# Patient Record
Sex: Female | Born: 1960 | Race: White | Hispanic: No | Marital: Married | Smoking: Current every day smoker
Health system: Southern US, Community
[De-identification: ages and names within clinical notes are randomized; demographics above are authoritative.]

## PROBLEM LIST (undated history)

## (undated) DIAGNOSIS — F419 Anxiety disorder, unspecified: Secondary | ICD-10-CM

## (undated) DIAGNOSIS — F329 Major depressive disorder, single episode, unspecified: Secondary | ICD-10-CM

## (undated) DIAGNOSIS — N3010 Interstitial cystitis (chronic) without hematuria: Secondary | ICD-10-CM

## (undated) DIAGNOSIS — K219 Gastro-esophageal reflux disease without esophagitis: Secondary | ICD-10-CM

## (undated) DIAGNOSIS — G43909 Migraine, unspecified, not intractable, without status migrainosus: Secondary | ICD-10-CM

## (undated) DIAGNOSIS — M26609 Unspecified temporomandibular joint disorder, unspecified side: Secondary | ICD-10-CM

## (undated) DIAGNOSIS — F32A Depression, unspecified: Secondary | ICD-10-CM

## (undated) DIAGNOSIS — E039 Hypothyroidism, unspecified: Secondary | ICD-10-CM

## (undated) DIAGNOSIS — M797 Fibromyalgia: Secondary | ICD-10-CM

## (undated) DIAGNOSIS — J45909 Unspecified asthma, uncomplicated: Secondary | ICD-10-CM

## (undated) HISTORY — PX: CARDIAC CATHETERIZATION: SHX172

---

## 1992-02-07 HISTORY — PX: NASAL SINUS SURGERY: SHX719

## 1997-10-17 ENCOUNTER — Emergency Department (HOSPITAL_COMMUNITY): Admission: EM | Admit: 1997-10-17 | Discharge: 1997-10-17 | Payer: Self-pay | Admitting: Emergency Medicine

## 2001-12-17 ENCOUNTER — Ambulatory Visit (HOSPITAL_BASED_OUTPATIENT_CLINIC_OR_DEPARTMENT_OTHER): Admission: RE | Admit: 2001-12-17 | Discharge: 2001-12-17 | Payer: Self-pay | Admitting: Urology

## 2001-12-17 HISTORY — PX: OTHER SURGICAL HISTORY: SHX169

## 2003-02-07 HISTORY — PX: ABDOMINAL HYSTERECTOMY: SHX81

## 2005-06-07 ENCOUNTER — Encounter: Payer: Self-pay | Admitting: Emergency Medicine

## 2008-09-08 HISTORY — PX: TRANSTHORACIC ECHOCARDIOGRAM: SHX275

## 2009-05-02 ENCOUNTER — Emergency Department (HOSPITAL_BASED_OUTPATIENT_CLINIC_OR_DEPARTMENT_OTHER): Admission: EM | Admit: 2009-05-02 | Discharge: 2009-05-02 | Payer: Self-pay | Admitting: Emergency Medicine

## 2009-05-02 ENCOUNTER — Ambulatory Visit: Payer: Self-pay | Admitting: Diagnostic Radiology

## 2009-11-01 ENCOUNTER — Encounter: Admission: RE | Admit: 2009-11-01 | Discharge: 2009-11-01 | Payer: Self-pay | Admitting: Neurosurgery

## 2009-11-16 ENCOUNTER — Ambulatory Visit (HOSPITAL_COMMUNITY)
Admission: RE | Admit: 2009-11-16 | Discharge: 2009-11-17 | Payer: Self-pay | Source: Home / Self Care | Admitting: Neurosurgery

## 2009-11-16 HISTORY — PX: ANTERIOR CERVICAL DECOMP/DISCECTOMY FUSION: SHX1161

## 2009-12-15 ENCOUNTER — Encounter: Admission: RE | Admit: 2009-12-15 | Discharge: 2009-12-15 | Payer: Self-pay | Admitting: Neurosurgery

## 2010-02-27 ENCOUNTER — Encounter: Payer: Self-pay | Admitting: Neurosurgery

## 2010-04-21 LAB — CBC
Hemoglobin: 14.2 g/dL (ref 12.0–15.0)
MCH: 33.5 pg (ref 26.0–34.0)
RBC: 4.24 MIL/uL (ref 3.87–5.11)
WBC: 7.5 10*3/uL (ref 4.0–10.5)

## 2010-04-21 LAB — SURGICAL PCR SCREEN: Staphylococcus aureus: NEGATIVE

## 2010-06-24 NOTE — Op Note (Signed)
NAME:  Kristin [name], Kristin [name]                         ACCOUNT NO.:  [id number]   MEDICAL RECORD NO.:  [id number]                   PATIENT TYPE:  AMB   LOCATION:  [facility]                                 FACILITY:  [facility]   PHYSICIAN:  Excell Seltzer. Annabell Howells, M.D.                 DATE OF BIRTH:  1960/05/17   DATE OF PROCEDURE:  12/17/2001  DATE OF DISCHARGE:                                 OPERATIVE REPORT   PROCEDURES:  1. Cystoscopy.  2. Hydrodistention of the bladder.  3. Urethral dilation.  4. Instillation of Pyridium and Marcaine.   PREOPERATIVE DIAGNOSIS:  Painful bladder with interstitial cystitis.   POSTOPERATIVE DIAGNOSIS:  Painful bladder with interstitial cystitis.   SURGEON:  Excell Seltzer. Annabell Howells, M.D.   ANESTHESIA:  General.   COMPLICATIONS:  None.   INDICATIONS:  [name] is a 50 year old white female with a history of  painful bladder, whom I did hydrodistention on 6-7 years ago with good  results.  She returns now with recurrent symptoms and after discussing  options, she wants to have the hydrodistention performed again.   FINDINGS AND PROCEDURE:  The patient was given p.o. Tequin.  She was taken  to the operating room where a general anesthetic was induced.  She was  placed in lithotomy position.  A B&O suppository was placed.  Her genitalia  was prepped with Betadine solution.  She was draped in the usual sterile  fashion.  Cystoscopy was performed using the 22 [place] scope and 12 and 70  degree lenses.  Examination revealed a normal urethra.  The bladder wall had  mild trabeculation without tumor, stones, or inflammation.  There was a  stellate scar from prior biopsy on the posterior wall of the bladder.  The  ureteral orifices were unremarkable.  After a thorough cystoscopy, the scope  was removed, and the 16 French Foley catheter was inserted.  The balloon was  filled with 10 cc of air, and her bladder was then filled to capacity under  80 cm of water pressure.  This was  held for five minutes, and the bladder  was then drained.  She held 900 cc under anesthesia.  The  terminal efflux  remained clear.  Cystoscopy was performed after hydrodistention.  This  revealed diffuse scattered glomerulations on the right and left lateral  walls of the bladder.  No cracking or Hunner's ulcers were identified.  At  this point, the bladder was drained, and the urethra was calibrated to 34  [place].  There was no evidence of stenosis or stricture.  A Foley catheter  was then reinserted  and 15 cc of 0.25% Marcaine with 400 mg of crushed Pyridium were instilled  into the bladder.  The catheter was removed.  The patient was taken down  from lithotomy position.  Her anesthetic was reversed.  She was moved to the  recovery room in stable condition.  There were no complications.                                               Excell Seltzer. Annabell Howells, M.D.    JJW/MEDQ  D:  12/17/2001  T:  12/17/2001  Job:  [id number]   cc:   Dr. Garwin Brothers [place], [place]

## 2010-09-19 ENCOUNTER — Other Ambulatory Visit (HOSPITAL_COMMUNITY)
Admission: RE | Admit: 2010-09-19 | Discharge: 2010-09-19 | Disposition: A | Discharge status: Home / Self Care | Payer: Managed Care, Other (non HMO) | Source: Ambulatory Visit | Attending: Obstetrics & Gynecology | Admitting: Obstetrics & Gynecology

## 2010-09-19 DIAGNOSIS — Z1272 Encounter for screening for malignant neoplasm of vagina: Secondary | ICD-10-CM | POA: Insufficient documentation

## 2012-01-07 IMAGING — CT CT CERVICAL SPINE W/ CM
4 of 5 series · 15 of 35 positions shown, 16 images · IV contrast (omnipaque)
Comparison: Radiography [DATE].  Outside MRI.

CLINICAL DATA: Neck, bilateral shoulder and arm pain.

 MYELOGRAM INJECTION
TECHNIQUE: Informed consent was obtained from the patient prior to
the procedure, including potential complications of headache,
allergy, infection and pain.  A timeout procedure was performed.
With the patient prone, the lower back was prepped with Betadine.
1% Lidocaine was used for local anesthesia.  Lumbar puncture was
performed at the right L3-4 level using a 22 gauge needle with
return of clear CSF.  Nine ml of Omnipaque 033was injected into the
subarachnoid space .
TECHNIQUE: Following injection of intrathecal Omnipaque contrast,
spine imaging in multiple projections was performed using
fluoroscopy.
Fluoroscopy Time: 59 seconds .
TECHNIQUE: CT imaging of the cervical spine was performed after
intrathecal contrast administration. Multiplanar CT image
reconstructions were also generated.

[Series 2: c spine bone · axial · 0.27mm/px · z∈[+147,+210]mm · 2 of 75 slices shown]
[im 25/75  bone]
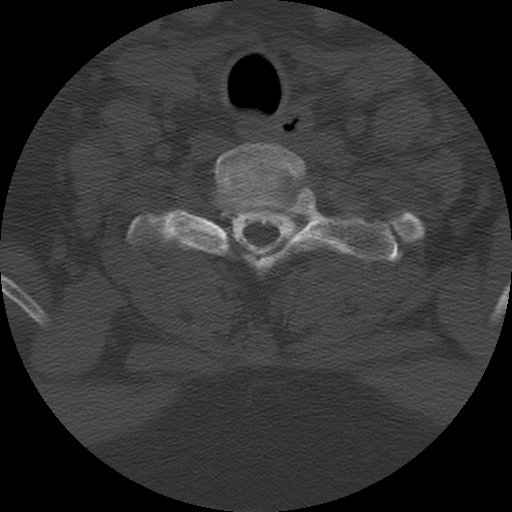
[im 50/75  bone]
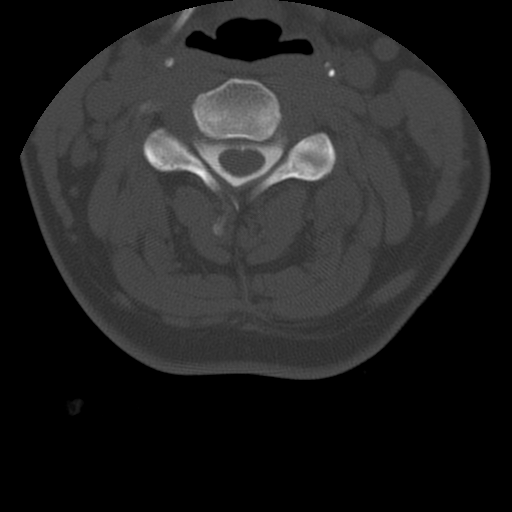

[Series 400: cor c spine · coronal · 0.38mm/px · 3 of 40 slices shown]
[im 8/40  bone]
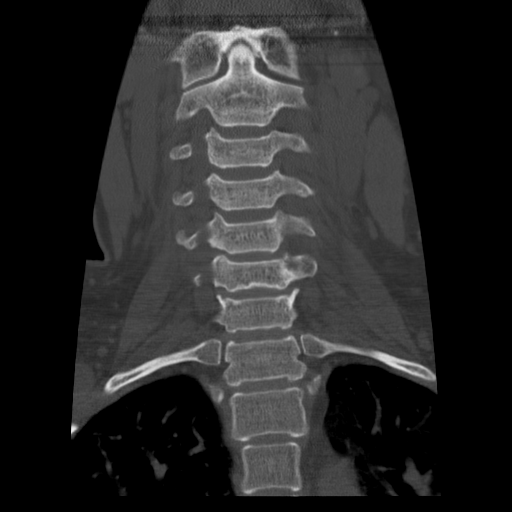
[im 16/40  bone]
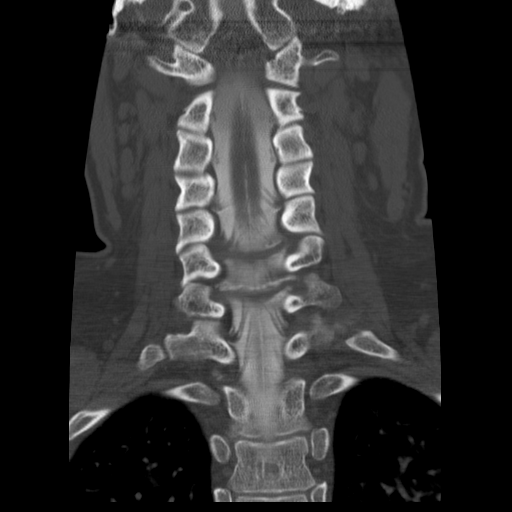
[im 24/40  bone]
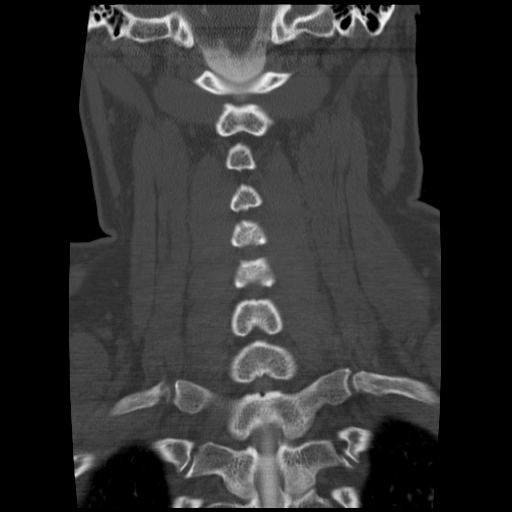

[Series 401: sag c spine · sagittal · 0.38mm/px · 6 of 40 slices shown]
[im 14/40  bone]
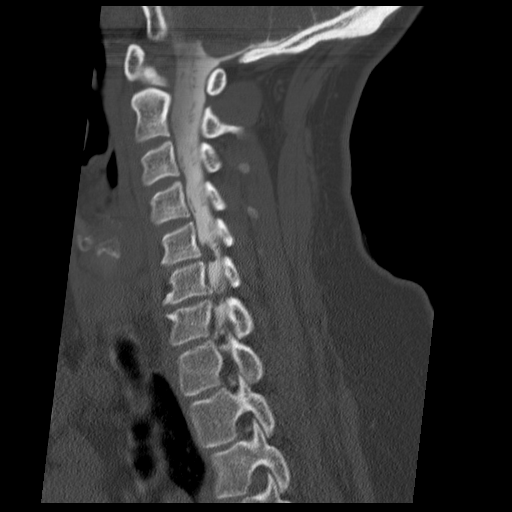
[im 17/40  bone]
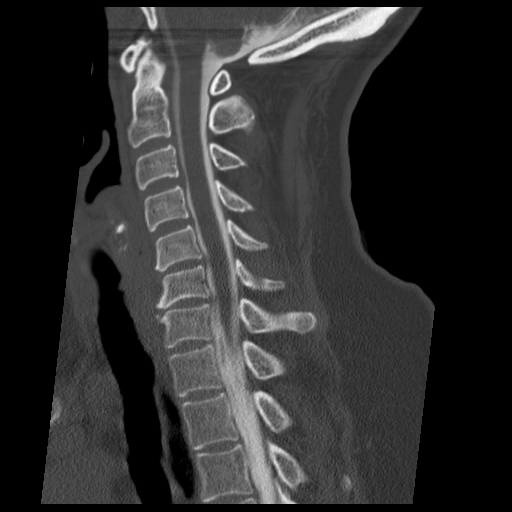
[im 20/40  bone]
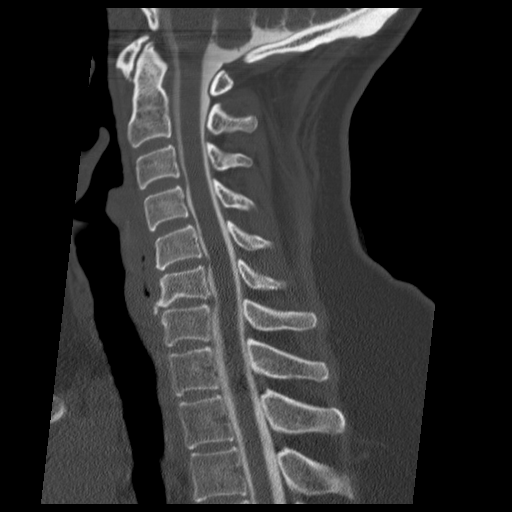
[im 23/40  bone]
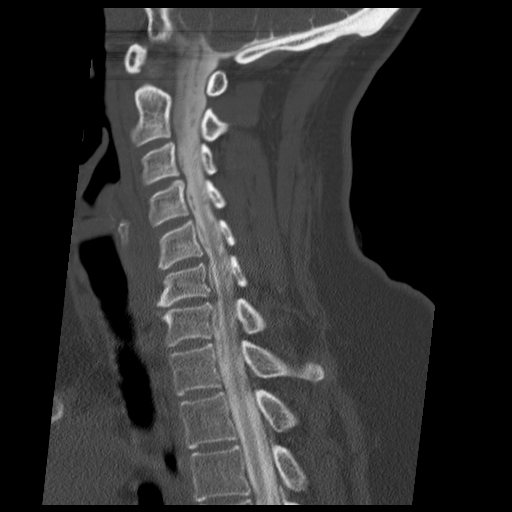
[im 27/40  bone]
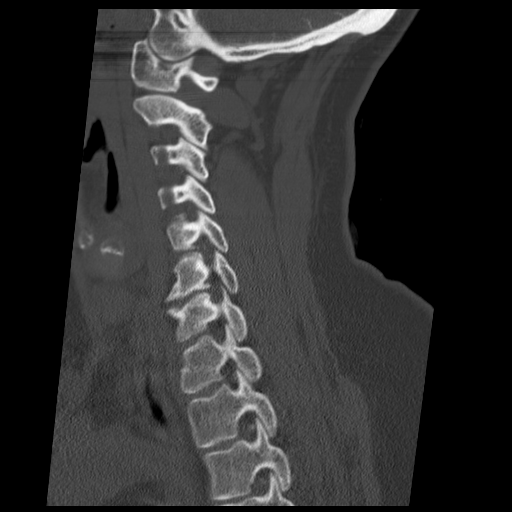
[im 28/40  soft-tissue]
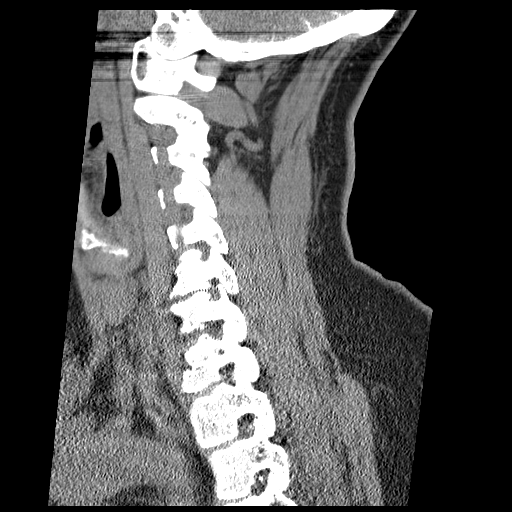

[Series 402: angled axial c spine · axial · 0.23mm/px · z∈[+113,+208]mm · 4 of 104 slices shown, 5 images]
[im 21/104  soft-tissue]
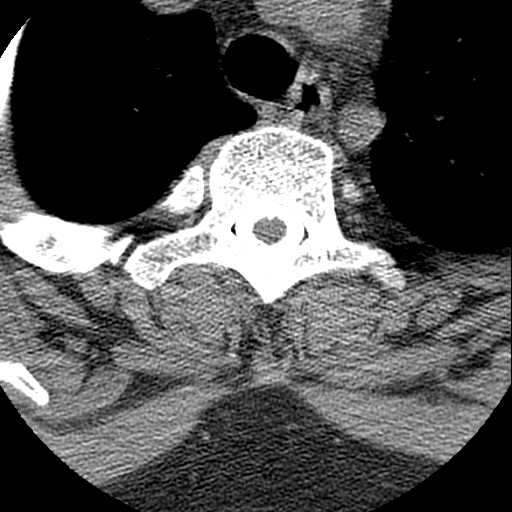
[im 21/104  bone]
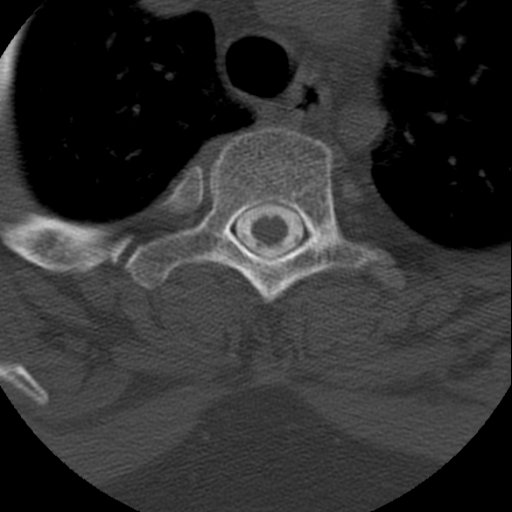
[im 42/104  bone]
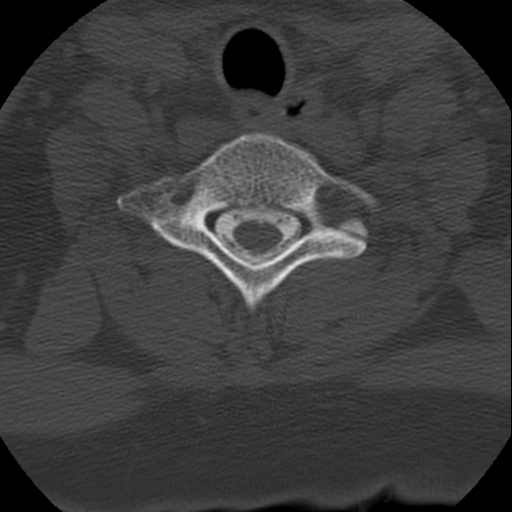
[im 62/104  bone]
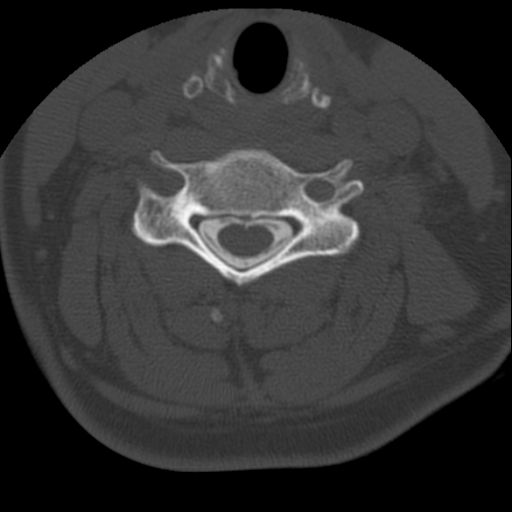
[im 83/104  bone]
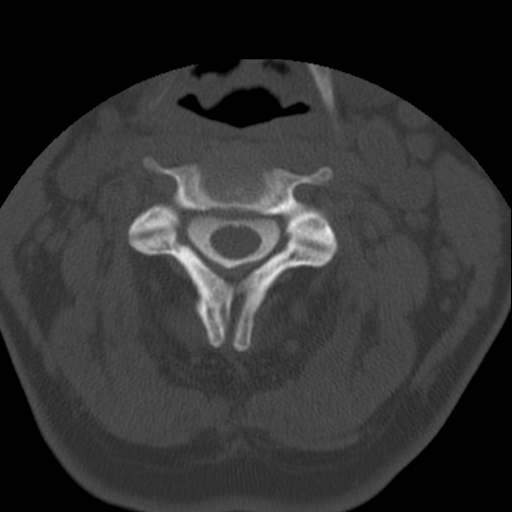

[15 of 35 positions shown; findings below may reference images not displayed]

IMPRESSION: Successful injection of  intrathecal contrast for myelography.

MYELOGRAM CERVICAL
FINDINGS: There is diminished filling of both C5, C6 and C7 root
sleeves.  There may be slightly diminished filling of the C8 root
sleeves.  The largest abnormality effects the right C6 root sleeve
and the left C7 root sleeve.  There are anterior extradural defects
at these levels but no apparent compressive effect upon the cord.
IMPRESSION: Diminished filling of both C5, C6 and C7 root sleeves.  Slightly
diminished filling of the C8 root sleeves.  See above.

CT MYELOGRAPHY CERVICAL SPINE
FINDINGS: The foramen magnum is widely patent.  There is ordinary
degenerative change between the anterior arch of C1 and the dens.
No neural compression.

C2-3:  Normal interspace.

C3-4:  Minimal uncovertebral prominence.  No significant narrowing
of the canal or foramina.

C4-5:  There is uncovertebral prominence bilaterally, slightly more
on the right.  There is mild foraminal narrowing, not definitely
compressive.

C5-6:  There is spondylosis with endplate osteophytes and shallow
protrusion of disc material.  There is a right posterolateral to
foraminal disc herniation apparently compressing the right C6 nerve
root with marked swelling of the nerve root.  There is also
considerable but slightly less pronounced foraminal disease on the
left.

At C6-7, there is spondylosis with endplate osteophytes and
protruding disc material.  There is a large soft tissue density
lesion in the foramen on the left consistent with a foraminal disc
herniation.  This could certainly compress the left C7 root sleeve.
There is moderate foraminal narrowing on the right primarily
because of osteophytic encroachment.

At C7-T1, there is mild uncovertebral prominence bilaterally but no
likely compressive lesion.
IMPRESSION: C5-6:  Large soft tissue density foraminal lesion on the right
likely represent a large right foraminal disc herniation
compressing the right C6 nerve root.  Lateral recess and foraminal
stenosis on the left as well, not as severe.

C6-7:  Large soft tissue density foraminal lesion on the left
likely to represent a large left foraminal disc herniation that
would certainly compress the left C7 root sleeve.  There is
foraminal stenosis on the right as well, primarily bony.

C4-5:  Mild uncovertebral degeneration bilaterally mild foraminal
narrowing, not grossly compressive.

## 2012-01-07 IMAGING — RF DG MYELOGRAM CERVICAL
8 series · 8 of 8 positions shown · IV contrast (omnipaque)
Comparison: Radiography [DATE].  Outside MRI.

CLINICAL DATA: Neck, bilateral shoulder and arm pain.

 MYELOGRAM INJECTION
TECHNIQUE: Informed consent was obtained from the patient prior to
the procedure, including potential complications of headache,
allergy, infection and pain.  A timeout procedure was performed.
With the patient prone, the lower back was prepped with Betadine.
1% Lidocaine was used for local anesthesia.  Lumbar puncture was
performed at the right L3-4 level using a 22 gauge needle with
return of clear CSF.  Nine ml of Omnipaque 033was injected into the
subarachnoid space .
TECHNIQUE: Following injection of intrathecal Omnipaque contrast,
spine imaging in multiple projections was performed using
fluoroscopy.
Fluoroscopy Time: 59 seconds .
TECHNIQUE: CT imaging of the cervical spine was performed after
intrathecal contrast administration. Multiplanar CT image
reconstructions were also generated.

[Series 1: myelogram  white · 1 of 1 slices shown (1 of 7)]
[im 1/1]
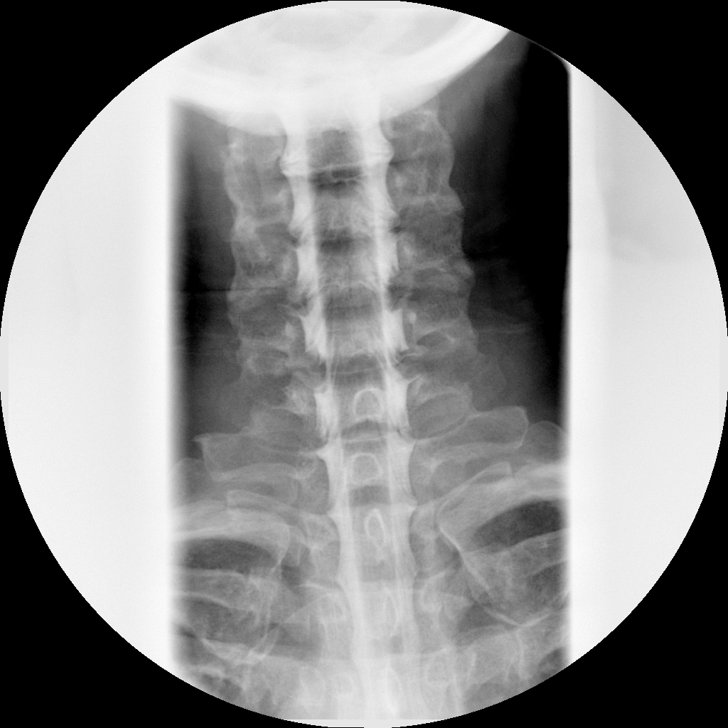

[Series 2: myelogram  white · 1 of 1 slices shown (2 of 7)]
[im 1/1]
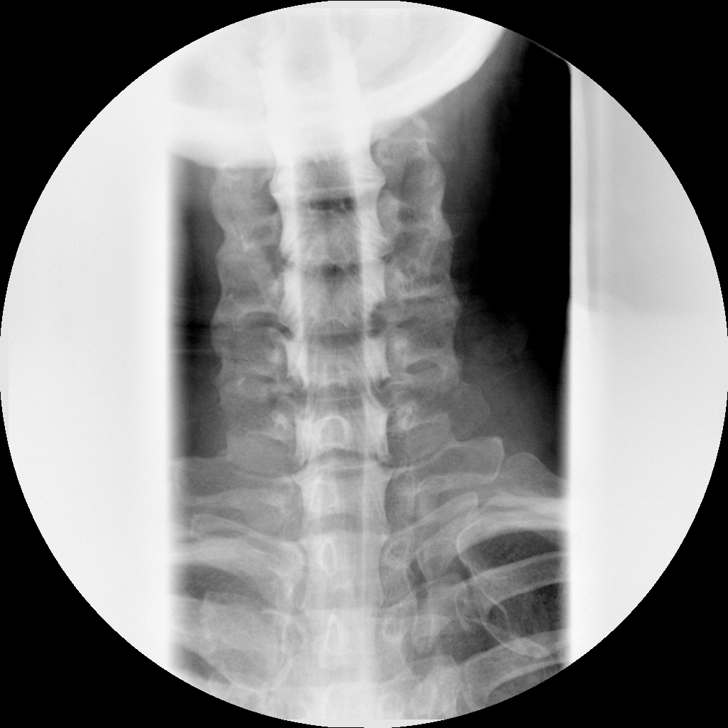

[Series 3: myelogram  white · 1 of 1 slices shown (3 of 7)]
[im 1/1]
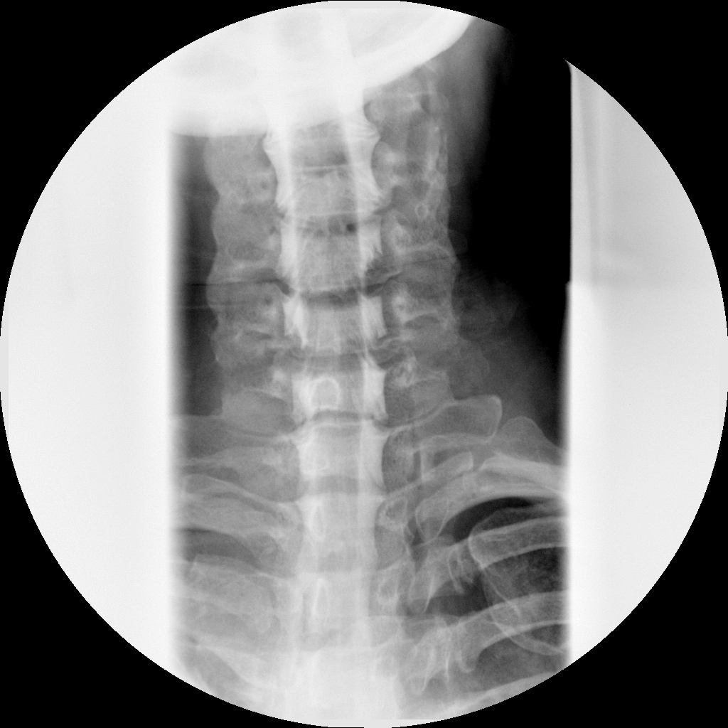

[Series 4: myelogram  white · 1 of 1 slices shown (4 of 7)]
[im 1/1]
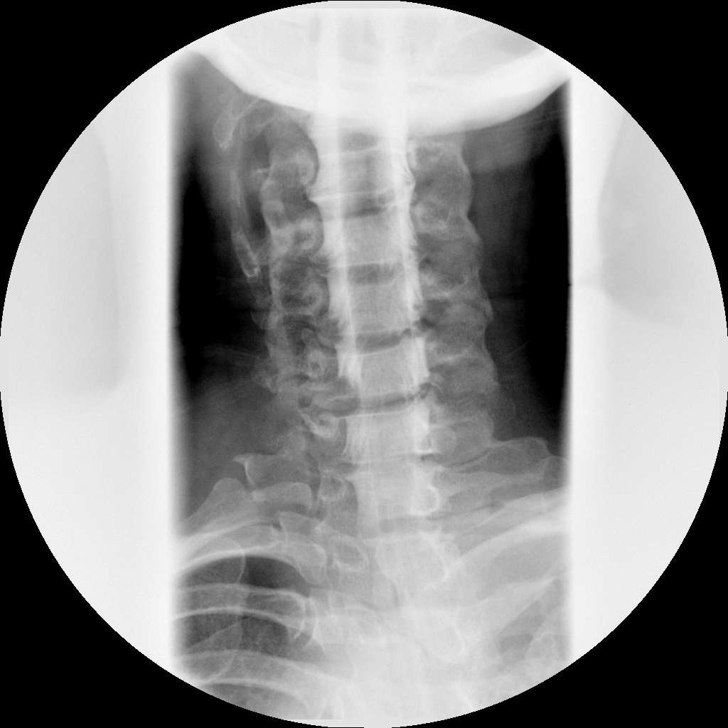

[Series 5: myelogram  white · 1 of 1 slices shown (5 of 7)]
[im 1/1]
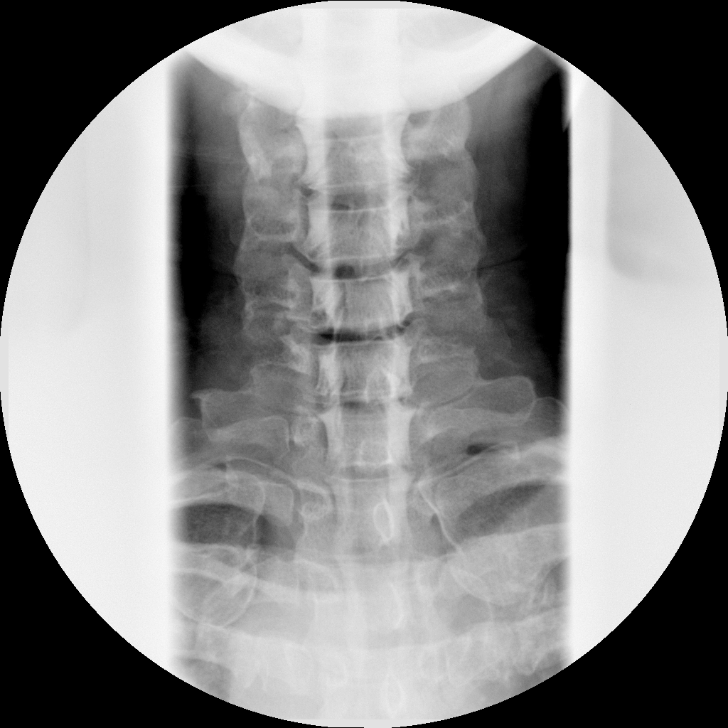

[Series 6: myelogram  white · 1 of 1 slices shown (6 of 7)]
[im 1/1]
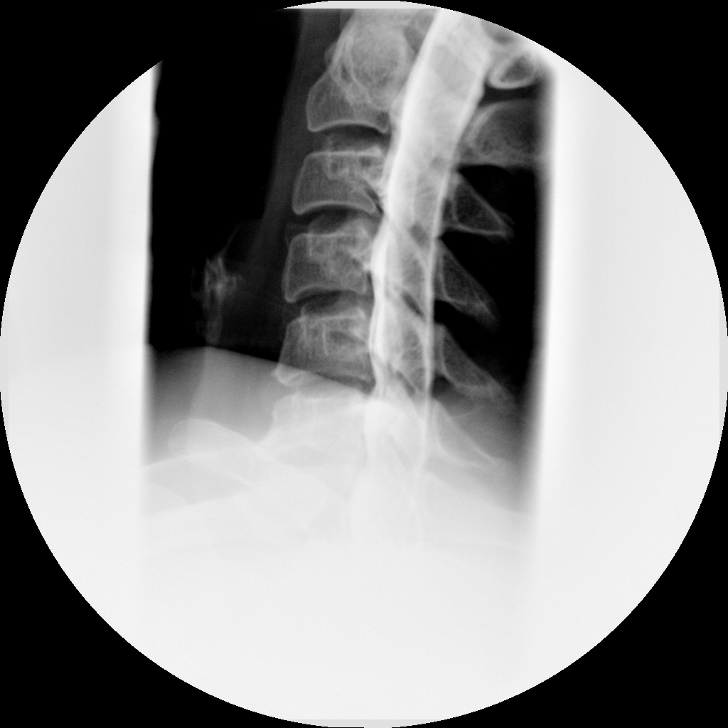

[Series 7: myelogram  white · 1 of 1 slices shown (7 of 7)]
[im 1/1]
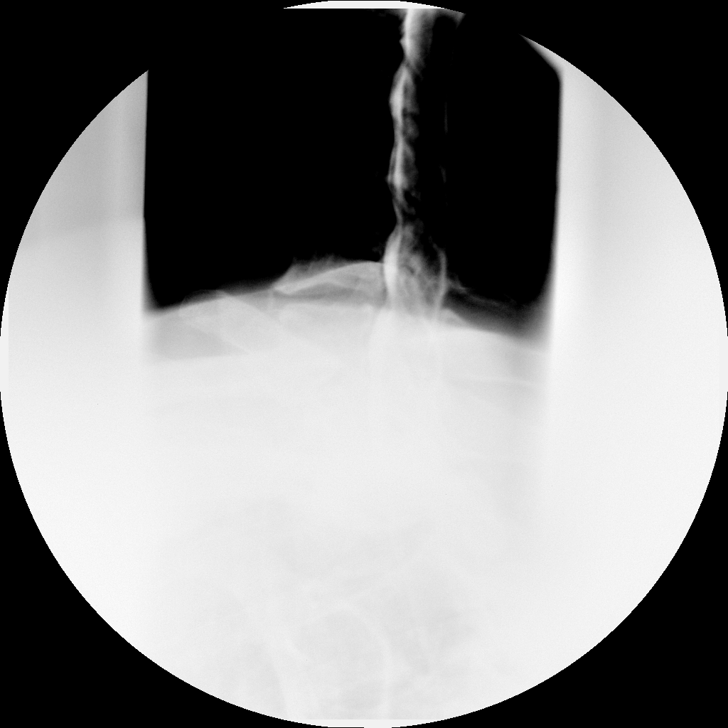

[Series 8: (hospital) · 1 of 1 slices shown]
[im 1/1]
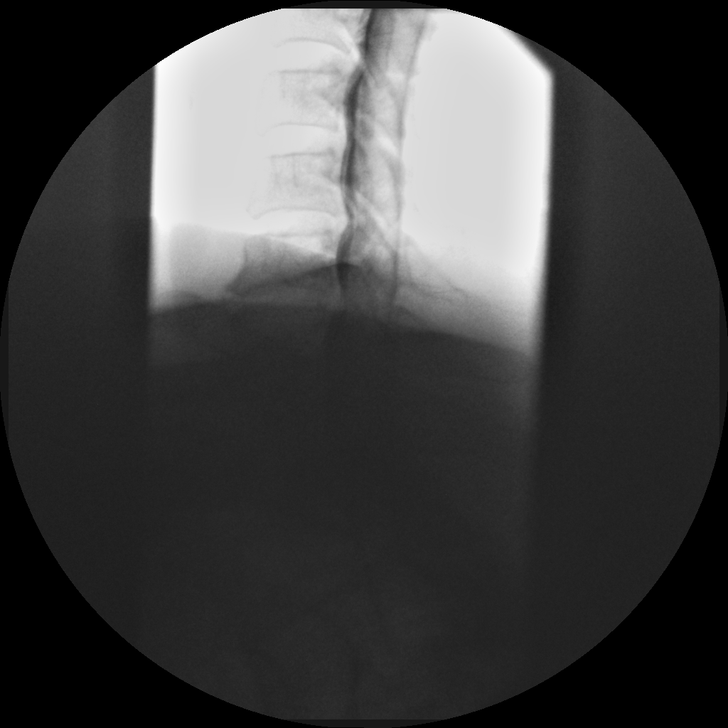

[8 of 8 positions shown; findings below may reference images not displayed]

IMPRESSION: Successful injection of  intrathecal contrast for myelography.

MYELOGRAM CERVICAL
FINDINGS: There is diminished filling of both C5, C6 and C7 root
sleeves.  There may be slightly diminished filling of the C8 root
sleeves.  The largest abnormality effects the right C6 root sleeve
and the left C7 root sleeve.  There are anterior extradural defects
at these levels but no apparent compressive effect upon the cord.
IMPRESSION: Diminished filling of both C5, C6 and C7 root sleeves.  Slightly
diminished filling of the C8 root sleeves.  See above.

CT MYELOGRAPHY CERVICAL SPINE
FINDINGS: The foramen magnum is widely patent.  There is ordinary
degenerative change between the anterior arch of C1 and the dens.
No neural compression.

C2-3:  Normal interspace.

C3-4:  Minimal uncovertebral prominence.  No significant narrowing
of the canal or foramina.

C4-5:  There is uncovertebral prominence bilaterally, slightly more
on the right.  There is mild foraminal narrowing, not definitely
compressive.

C5-6:  There is spondylosis with endplate osteophytes and shallow
protrusion of disc material.  There is a right posterolateral to
foraminal disc herniation apparently compressing the right C6 nerve
root with marked swelling of the nerve root.  There is also
considerable but slightly less pronounced foraminal disease on the
left.

At C6-7, there is spondylosis with endplate osteophytes and
protruding disc material.  There is a large soft tissue density
lesion in the foramen on the left consistent with a foraminal disc
herniation.  This could certainly compress the left C7 root sleeve.
There is moderate foraminal narrowing on the right primarily
because of osteophytic encroachment.

At C7-T1, there is mild uncovertebral prominence bilaterally but no
likely compressive lesion.
IMPRESSION: C5-6:  Large soft tissue density foraminal lesion on the right
likely represent a large right foraminal disc herniation
compressing the right C6 nerve root.  Lateral recess and foraminal
stenosis on the left as well, not as severe.

C6-7:  Large soft tissue density foraminal lesion on the left
likely to represent a large left foraminal disc herniation that
would certainly compress the left C7 root sleeve.  There is
foraminal stenosis on the right as well, primarily bony.

C4-5:  Mild uncovertebral degeneration bilaterally mild foraminal
narrowing, not grossly compressive.

## 2013-01-06 ENCOUNTER — Encounter (HOSPITAL_BASED_OUTPATIENT_CLINIC_OR_DEPARTMENT_OTHER): Payer: Self-pay | Admitting: *Deleted

## 2013-01-06 ENCOUNTER — Other Ambulatory Visit: Payer: Self-pay | Admitting: Urology

## 2013-01-06 MED ORDER — PHENAZOPYRIDINE HCL 200 MG PO TABS: Freq: Once | ORAL | Status: AC

## 2013-01-06 NOTE — H&P (Signed)
ctive Problems Problems   1. Abdominal pain (789.00)  2. Bladder pain (788.99)  3. Chronic interstitial cystitis without hematuria (595.1)  4. Intermittent urinary stream (788.61)  5. Nephrolithiasis (592.0)  6. Straining on urination (788.65)  7. Urinary urgency (788.63)  8. Weak urinary stream (788.62)  History of Present Illness    Kristin [name] returns today in f/u.  She has a history of IC and her symptoms have been variable but over the last 6 weeks her symptoms have persistant and severe.  She has constant urgency and small voids with persistent need to void.  She has nocturia x 2.  He has frequency q1hr. She has pain with a full bladder and gets some relief from voiding and from a heating pad.  She has no dysuria or hematuria.  She has no incontinence. She had an HOD twice with the last in 2003 and got relief from that procedure. She gets relief from Azo Standard.  Stress, certain foods and caffeine make it worse.   Past Medical History Problems   1. History of Anxiety (300.00)  2. History of Arthritis (V13.4)  3. History of depression (V11.8)  4. History of esophageal reflux (V12.79)  5. History of hypothyroidism (V12.29)  6. History of irritable bowel syndrome (V12.79)  7. History of migraine headaches (V12.49)  8. History of Murmur (785.2)  Surgical History Problems   1. History of Cystoscopy With Dilation Of Bladder Under Local Anesthesia  2. History of Hysterectomy  3. History of Neck Surgery  Current Meds  1. Azo-Standard TABS;  Therapy: (Recorded:01Dec2014) to Recorded  2. Baclofen 10 MG Oral Tablet;  Therapy: 26Jun2014 to Recorded  3. Levothyroxine Sodium 137 MCG Oral Tablet;  Therapy: 02Jul2014 to Recorded  4. Lexapro 10 MG Oral Tablet;  Therapy: (Recorded:16Oct2014) to Recorded  5. Meclizine HCl - 12.5 MG Oral Tablet; TAKE TABLET  PRN;  Therapy: 20Nov2014 to (Evaluate:20Dec2014) Recorded  6. Melatonin 5 MG Oral Capsule;  Therapy: (Recorded:01Dec2014) to  Recorded  7. Multi-Vitamin TABS;  Therapy: (Recorded:16Oct2014) to Recorded  8. Topamax 100 MG Oral Tablet;  Therapy: (Recorded:01Dec2014) to Recorded  9. Xanax 1 MG Oral Tablet;  Therapy: (Recorded:16Oct2014) to Recorded  Allergies Medication   1. No Known Drug Allergies  Family History Problems   1. Family history of Death In The Family Father   age 58 of gunshot wound  2. Family history of Family Health Status Number Of Children   1 adopted son  3. Family history of Heart Disease (V17.49)   grandmother and grandfather - did not indicate if paternal or maternal  4. Family history of Nephrolithiasis : Brother  Social History Problems   1. Denied: History of Alcohol Use  2. Caffeine Use  3. Current every day smoker (305.1)  4. Marital History - Currently Married  5. Occupation:   homemaker  6. Tobacco Use (V15.82)   smokes 1 ppd x 25 years  Stopped x 1 year and resumed  Review of Systems  Genitourinary: urinary frequency, urinary urgency, nocturia, urinary stream starts and stops and suprapubic pain, but no hematuria.  Gastrointestinal: diarrhea, but no flank pain   The patient presents with complaints of constipation (with IBS).  Constitutional: recent weight gain (since her brother died in Mar 29, 2022 from a cerebral aneurysm. ), but no fever.  Cardiovascular: no chest pain.  Respiratory: no shortness of breath and no cough.    Vitals Vital Signs [Data Includes: Last 1 Day]  Recorded: 01Dec2014 09:02AM  Blood Pressure: 106 / 69  Temperature: 97.6 F Heart Rate: 63  Physical Exam Constitutional: Well nourished and well developed . No acute distress.  Pulmonary: No respiratory distress and normal respiratory rhythm and effort.  Cardiovascular: Heart rate and rhythm are normal . No peripheral edema.    Results/Data Urine [Data Includes: Last 1 Day]   01Dec2014  COLOR YELLOW   APPEARANCE CLEAR   SPECIFIC GRAVITY 1.020   pH 5.5   GLUCOSE NEG mg/dL  BILIRUBIN NEG    KETONE NEG mg/dL  BLOOD NEG   PROTEIN NEG mg/dL  UROBILINOGEN 0.2 mg/dL  NITRITE NEG   LEUKOCYTE ESTERASE NEG    Assessment Assessed   1. Chronic interstitial cystitis without hematuria (595.1)  2. Bladder pain (788.99)   She has recurrent symptoms of IC and had a good response to HOD previously.   Plan Chronic interstitial cystitis without hematuria   1. Changed: From  Hydrocodone-Acetaminophen 7.5-325 MG Oral Tablet  To  Hydrocodone-Acetaminophen 7.5-325 MG Oral Tablet TAKE 1 TABLET EVERY 6 HOURS  AS NEEDED FOR PAIN  2. Follow-up Schedule Surgery Office  Follow-up  Status: Hold For - Appointment   Requested for: 01Dec2014 Health Maintenance   3. UA With REFLEX; Status:Resulted - Requires Verification;   Done: 01Dec2014 08:46AM   I discussed medical therapy, office instillations and HOD and she would like to go ahead with the HOD.  I reviewed the risks of bleeding, infection, difficulty voiding, bladder injury with need for a foley or repair, thrombotic events.   Discussion/Summary  CC: Dr. Zollie Pee.

## 2013-01-06 NOTE — Progress Notes (Signed)
NPO AFTER MN. ARRIVE AT 0730. NEEDS HG. WILL TAKE SYNTHROID AM DOS W/ SIP OF WATER AND IF NEEDED HYDROCODONE.

## 2013-01-07 ENCOUNTER — Encounter (HOSPITAL_BASED_OUTPATIENT_CLINIC_OR_DEPARTMENT_OTHER): Payer: Self-pay | Admitting: *Deleted

## 2013-01-07 ENCOUNTER — Ambulatory Visit (HOSPITAL_BASED_OUTPATIENT_CLINIC_OR_DEPARTMENT_OTHER)
Admission: RE | Admit: 2013-01-07 | Discharge: 2013-01-07 | Disposition: A | Discharge status: Home / Self Care | Payer: Managed Care, Other (non HMO) | Source: Ambulatory Visit | Attending: Urology | Admitting: Urology

## 2013-01-07 ENCOUNTER — Encounter (HOSPITAL_BASED_OUTPATIENT_CLINIC_OR_DEPARTMENT_OTHER): Payer: Managed Care, Other (non HMO) | Admitting: Anesthesiology

## 2013-01-07 ENCOUNTER — Ambulatory Visit (HOSPITAL_BASED_OUTPATIENT_CLINIC_OR_DEPARTMENT_OTHER): Payer: Managed Care, Other (non HMO) | Admitting: Anesthesiology

## 2013-01-07 ENCOUNTER — Encounter (HOSPITAL_BASED_OUTPATIENT_CLINIC_OR_DEPARTMENT_OTHER)
Admission: RE | Disposition: A | Discharge status: Home / Self Care | Payer: Self-pay | Source: Ambulatory Visit | Attending: Urology

## 2013-01-07 DIAGNOSIS — Z79899 Other long term (current) drug therapy: Secondary | ICD-10-CM | POA: Insufficient documentation

## 2013-01-07 DIAGNOSIS — K219 Gastro-esophageal reflux disease without esophagitis: Secondary | ICD-10-CM | POA: Insufficient documentation

## 2013-01-07 DIAGNOSIS — E039 Hypothyroidism, unspecified: Secondary | ICD-10-CM | POA: Insufficient documentation

## 2013-01-07 DIAGNOSIS — N3010 Interstitial cystitis (chronic) without hematuria: Secondary | ICD-10-CM | POA: Insufficient documentation

## 2013-01-07 DIAGNOSIS — Z90710 Acquired absence of both cervix and uterus: Secondary | ICD-10-CM | POA: Insufficient documentation

## 2013-01-07 HISTORY — DX: Hypothyroidism, unspecified: E03.9

## 2013-01-07 HISTORY — DX: Unspecified temporomandibular joint disorder, unspecified side: M26.609

## 2013-01-07 HISTORY — DX: Anxiety disorder, unspecified: F41.9

## 2013-01-07 HISTORY — DX: Gastro-esophageal reflux disease without esophagitis: K21.9

## 2013-01-07 HISTORY — PX: CYSTO WITH HYDRODISTENSION: SHX5453

## 2013-01-07 HISTORY — DX: Fibromyalgia: M79.7

## 2013-01-07 HISTORY — DX: Major depressive disorder, single episode, unspecified: F32.9

## 2013-01-07 HISTORY — DX: Interstitial cystitis (chronic) without hematuria: N30.10

## 2013-01-07 HISTORY — DX: Migraine, unspecified, not intractable, without status migrainosus: G43.909

## 2013-01-07 HISTORY — DX: Unspecified asthma, uncomplicated: J45.909

## 2013-01-07 HISTORY — DX: Depression, unspecified: F32.A

## 2013-01-07 LAB — POCT HEMOGLOBIN-HEMACUE: Hemoglobin: 13.2 g/dL (ref 12.0–15.0)

## 2013-01-07 SURGERY — CYSTOSCOPY, WITH BLADDER HYDRODISTENSION
Anesthesia: General | Site: Bladder

## 2013-01-07 MED ORDER — BELLADONNA ALKALOIDS-OPIUM 16.2-60 MG RE SUPP
RECTAL | Status: DC | PRN
  Administered 2013-01-07: 1 via RECTAL

## 2013-01-07 MED ORDER — PROPOFOL 10 MG/ML IV BOLUS
INTRAVENOUS | Status: DC | PRN
  Administered 2013-01-07: 200 mg via INTRAVENOUS

## 2013-01-07 MED ORDER — OXYCODONE HCL 5 MG PO TABS
ORAL_TABLET | ORAL | Status: AC
  Filled 2013-01-07: qty 1

## 2013-01-07 MED ORDER — MIDAZOLAM HCL 5 MG/5ML IJ SOLN
INTRAMUSCULAR | Status: DC | PRN
  Administered 2013-01-07: 2 mg via INTRAVENOUS

## 2013-01-07 MED ORDER — FENTANYL CITRATE 0.05 MG/ML IJ SOLN
25.0000 ug | INTRAMUSCULAR | Status: DC | PRN
  Filled 2013-01-07: qty 1

## 2013-01-07 MED ORDER — SODIUM CHLORIDE 0.9 % IV SOLN
250.0000 mL | INTRAVENOUS | Status: DC | PRN
  Filled 2013-01-07: qty 250

## 2013-01-07 MED ORDER — PHENAZOPYRIDINE HCL 200 MG PO TABS
ORAL | Status: DC | PRN
  Administered 2013-01-07: 09:00:00 via INTRAVESICAL

## 2013-01-07 MED ORDER — ONDANSETRON HCL 4 MG/2ML IJ SOLN
4.0000 mg | Freq: Four times a day (QID) | INTRAMUSCULAR | Status: DC | PRN
  Filled 2013-01-07: qty 2

## 2013-01-07 MED ORDER — SODIUM CHLORIDE 0.9 % IJ SOLN
3.0000 mL | INTRAMUSCULAR | Status: DC | PRN
  Filled 2013-01-07: qty 3

## 2013-01-07 MED ORDER — ACETAMINOPHEN 325 MG PO TABS
650.0000 mg | ORAL_TABLET | ORAL | Status: DC | PRN
  Filled 2013-01-07: qty 2

## 2013-01-07 MED ORDER — BUPIVACAINE HCL 0.5 % IJ SOLN: INTRAMUSCULAR | Status: DC | PRN

## 2013-01-07 MED ORDER — BELLADONNA ALKALOIDS-OPIUM 16.2-60 MG RE SUPP
RECTAL | Status: AC
  Filled 2013-01-07: qty 1

## 2013-01-07 MED ORDER — LIDOCAINE HCL (CARDIAC) 20 MG/ML IV SOLN
INTRAVENOUS | Status: DC | PRN
  Administered 2013-01-07: 80 mg via INTRAVENOUS

## 2013-01-07 MED ORDER — PHENAZOPYRIDINE HCL 100 MG PO TABS
ORAL_TABLET | ORAL | Status: AC
  Filled 2013-01-07: qty 2

## 2013-01-07 MED ORDER — LACTATED RINGERS IV SOLN
INTRAVENOUS | Status: DC
  Filled 2013-01-07: qty 1000

## 2013-01-07 MED ORDER — ONDANSETRON HCL 4 MG/2ML IJ SOLN
INTRAMUSCULAR | Status: DC | PRN
  Administered 2013-01-07: 4 mg via INTRAVENOUS

## 2013-01-07 MED ORDER — MIDAZOLAM HCL 2 MG/2ML IJ SOLN
INTRAMUSCULAR | Status: AC
  Filled 2013-01-07: qty 2

## 2013-01-07 MED ORDER — SODIUM CHLORIDE 0.9 % IJ SOLN
3.0000 mL | Freq: Two times a day (BID) | INTRAMUSCULAR | Status: DC
  Filled 2013-01-07: qty 3

## 2013-01-07 MED ORDER — FENTANYL CITRATE 0.05 MG/ML IJ SOLN
INTRAMUSCULAR | Status: AC
  Filled 2013-01-07: qty 2

## 2013-01-07 MED ORDER — DEXAMETHASONE SODIUM PHOSPHATE 4 MG/ML IJ SOLN
INTRAMUSCULAR | Status: DC | PRN
  Administered 2013-01-07: 10 mg via INTRAVENOUS

## 2013-01-07 MED ORDER — PHENAZOPYRIDINE HCL 200 MG PO TABS
200.0000 mg | ORAL_TABLET | Freq: Three times a day (TID) | ORAL | Status: DC
  Administered 2013-01-07: 200 mg via ORAL
  Filled 2013-01-07: qty 1

## 2013-01-07 MED ORDER — PHENAZOPYRIDINE HCL 200 MG PO TABS: 200.0000 mg | ORAL_TABLET | Freq: Three times a day (TID) | ORAL | Status: DC | PRN

## 2013-01-07 MED ORDER — CIPROFLOXACIN IN D5W 400 MG/200ML IV SOLN
400.0000 mg | INTRAVENOUS | Status: AC
  Administered 2013-01-07: 400 mg via INTRAVENOUS
  Filled 2013-01-07: qty 200

## 2013-01-07 MED ORDER — STERILE WATER FOR IRRIGATION IR SOLN
Status: DC | PRN
  Administered 2013-01-07: 3000 mL

## 2013-01-07 MED ORDER — FENTANYL CITRATE 0.05 MG/ML IJ SOLN
INTRAMUSCULAR | Status: DC | PRN
  Administered 2013-01-07: 50 ug via INTRAVENOUS

## 2013-01-07 MED ORDER — CIPROFLOXACIN IN D5W 400 MG/200ML IV SOLN
INTRAVENOUS | Status: AC
  Filled 2013-01-07: qty 200

## 2013-01-07 MED ORDER — ACETAMINOPHEN 650 MG RE SUPP
650.0000 mg | RECTAL | Status: DC | PRN
  Filled 2013-01-07: qty 1

## 2013-01-07 MED ORDER — KETOROLAC TROMETHAMINE 30 MG/ML IJ SOLN
INTRAMUSCULAR | Status: DC | PRN
  Administered 2013-01-07: 30 mg via INTRAVENOUS

## 2013-01-07 MED ORDER — OXYCODONE HCL 5 MG PO TABS
5.0000 mg | ORAL_TABLET | ORAL | Status: DC | PRN
  Administered 2013-01-07: 5 mg via ORAL
  Filled 2013-01-07: qty 2

## 2013-01-07 MED ORDER — LACTATED RINGERS IV SOLN
INTRAVENOUS | Status: DC
  Administered 2013-01-07: 08:00:00 via INTRAVENOUS
  Filled 2013-01-07: qty 1000

## 2013-01-07 SURGICAL SUPPLY — 17 items
BAG DRAIN URO-CYSTO SKYTR STRL (DRAIN) ×2 IMPLANT
CANISTER SUCT LVC 12 LTR MEDI- (MISCELLANEOUS) ×2 IMPLANT
CATH ROBINSON RED A/P 16FR (CATHETERS) ×2 IMPLANT
CLOTH BEACON ORANGE TIMEOUT ST (SAFETY) ×2 IMPLANT
DRAPE CAMERA CLOSED 9X96 (DRAPES) ×2 IMPLANT
ELECT REM PT RETURN 9FT ADLT (ELECTROSURGICAL) ×2
ELECTRODE REM PT RTRN 9FT ADLT (ELECTROSURGICAL) ×1 IMPLANT
GLOVE BIOGEL PI IND STRL 7.5 (GLOVE) ×2 IMPLANT
GLOVE BIOGEL PI INDICATOR 7.5 (GLOVE) ×2
GLOVE SURG SS PI 8.0 STRL IVOR (GLOVE) ×2 IMPLANT
GOWN STRL REIN XL XLG (GOWN DISPOSABLE) ×4 IMPLANT
NDL SAFETY ECLIPSE 18X1.5 (NEEDLE) ×1 IMPLANT
NEEDLE HYPO 18GX1.5 SHARP (NEEDLE) ×1
NS IRRIG 500ML POUR BTL (IV SOLUTION) IMPLANT
PACK CYSTOSCOPY (CUSTOM PROCEDURE TRAY) ×2 IMPLANT
SYR 30ML LL (SYRINGE) ×2 IMPLANT
WATER STERILE IRR 3000ML UROMA (IV SOLUTION) ×2 IMPLANT

## 2013-01-07 NOTE — Transfer of Care (Signed)
Immediate Anesthesia Transfer of Care Note  Patient: Kristin [name]  Procedure(s) Performed: Procedure(s): CYSTOSCOPY/HYDRODISTENSION, INSTILL MARCAINE AND PYRIDIUM (N/A)  Patient Location: PACU  Anesthesia Type:General  Level of Consciousness: sedated  Airway & Oxygen Therapy: Patient Spontanous Breathing and Patient connected to nasal cannula oxygen  Post-op Assessment: Report given to PACU RN  Post vital signs: Reviewed and stable  Complications: No apparent anesthesia complications

## 2013-01-07 NOTE — Anesthesia Preprocedure Evaluation (Addendum)
Anesthesia Evaluation  Patient identified by MRN, date of birth, ID band Patient awake    Reviewed: Allergy & Precautions, H&P , NPO status , Patient's Chart, lab work & pertinent test results  Airway Mallampati: II TM Distance: >3 FB Neck ROM: full    Dental no notable dental hx. (+) Teeth Intact and Dental Advisory Given   Pulmonary asthma , Current Smoker,  breath sounds clear to auscultation  Pulmonary exam normal       Cardiovascular Exercise Tolerance: Good negative cardio ROS  Rhythm:regular Rate:Normal     Neuro/Psych  Headaches, Anxiety Depression Cervical fusion negative neurological ROS  negative psych ROS   GI/Hepatic negative GI ROS, Neg liver ROS, GERD-  Medicated and Controlled,  Endo/Other  negative endocrine ROSHypothyroidism   Renal/GU negative Renal ROS  negative genitourinary   Musculoskeletal  (+) Fibromyalgia -  Abdominal   Peds  Hematology negative hematology ROS (+)   Anesthesia Other Findings TMJ problems  Reproductive/Obstetrics negative OB ROS                          Anesthesia Physical Anesthesia Plan  ASA: II  Anesthesia Plan: General   Post-op Pain Management:    Induction: Intravenous  Airway Management Planned: LMA  Additional Equipment:   Intra-op Plan:   Post-operative Plan:   Informed Consent: I have reviewed the patients History and Physical, chart, labs and discussed the procedure including the risks, benefits and alternatives for the proposed anesthesia with the patient or authorized representative who has indicated his/her understanding and acceptance.   Dental Advisory Given  Plan Discussed with: CRNA and Surgeon  Anesthesia Plan Comments:         Anesthesia Quick Evaluation

## 2013-01-07 NOTE — Anesthesia Procedure Notes (Signed)
Procedure Name: LMA Insertion Date/Time: 01/07/2013 9:08 AM Performed by: Maris Berger T Pre-anesthesia Checklist: Patient identified, Emergency Drugs available, Suction available and Patient being monitored Patient Re-evaluated:Patient Re-evaluated prior to inductionOxygen Delivery Method: Circle System Utilized Preoxygenation: Pre-oxygenation with 100% oxygen Intubation Type: IV induction Ventilation: Mask ventilation without difficulty LMA: LMA inserted LMA Size: 4.0 Number of attempts: 1 Airway Equipment and Method: bite block Placement Confirmation: positive ETCO2 Dental Injury: Teeth and Oropharynx as per pre-operative assessment

## 2013-01-07 NOTE — Brief Op Note (Signed)
01/07/2013  9:22 AM  PATIENT:  Rosalita Chessman  52 y.o. female  PRE-OPERATIVE DIAGNOSIS:  INTERSTITIAL CYSTITIS  POST-OPERATIVE DIAGNOSIS:  INTERSTITIAL CYSTITIS  PROCEDURE:  Procedure(s): CYSTOSCOPY/HYDRODISTENSION, INSTILL MARCAINE AND PYRIDIUM (N/A)  SURGEON:  Surgeon(s) and Role:    * Bjorn Pippin, MD - Primary  PHYSICIAN ASSISTANT:   ASSISTANTS: none   ANESTHESIA:   general  EBL:  Total I/O In: 100 [I.V.:100] Out: -   BLOOD ADMINISTERED:none  DRAINS: none   LOCAL MEDICATIONS USED:  MARCAINE     SPECIMEN:  No Specimen  DISPOSITION OF SPECIMEN:  N/A  COUNTS:  YES  TOURNIQUET:  * No tourniquets in log *  DICTATION: .Other Dictation: Dictation Number [id number]  PLAN OF CARE: Discharge to home after PACU  PATIENT DISPOSITION:  PACU - hemodynamically stable.   Delay start of Pharmacological VTE agent (>24hrs) due to surgical blood loss or risk of bleeding: not applicable

## 2013-01-07 NOTE — Interval H&P Note (Signed)
History and Physical Interval Note:  01/07/2013 8:56 AM  Kristin [name]  has presented today for surgery, with the diagnosis of INTERSTITIAL CYSTITIS  The various methods of treatment have been discussed with the patient and family. After consideration of risks, benefits and other options for treatment, the patient has consented to  Procedure(s): CYSTOSCOPY/HYDRODISTENSION, INSTILL MARCAINE AND PYRIDIUM (N/A) as a surgical intervention .  The patient's history has been reviewed, patient examined, no change in status, stable for surgery.  I have reviewed the patient's chart and labs.  Questions were answered to the patient's satisfaction.     [name] J

## 2013-01-07 NOTE — Anesthesia Postprocedure Evaluation (Signed)
  Anesthesia Post-op Note  Patient: Kristin [name]  Procedure(s) Performed: Procedure(s) (LRB): CYSTOSCOPY/HYDRODISTENSION, INSTILL MARCAINE AND PYRIDIUM (N/A)  Patient Location: PACU  Anesthesia Type: General  Level of Consciousness: awake and alert   Airway and Oxygen Therapy: Patient Spontanous Breathing  Post-op Pain: mild  Post-op Assessment: Post-op Vital signs reviewed, Patient's Cardiovascular Status Stable, Respiratory Function Stable, Patent Airway and No signs of Nausea or vomiting  Last Vitals:  Filed Vitals:   01/07/13 0945  BP: 117/74  Pulse: 56  Temp:   Resp: 8    Post-op Vital Signs: stable   Complications: No apparent anesthesia complications

## 2013-01-08 ENCOUNTER — Encounter (HOSPITAL_BASED_OUTPATIENT_CLINIC_OR_DEPARTMENT_OTHER): Payer: Self-pay | Admitting: Urology

## 2013-01-09 NOTE — Op Note (Signed)
[name], Kristin [name]               ACCOUNT NO.:  [id number]  MEDICAL RECORD NO.:  [id number]  LOCATION:                                 FACILITY:  PHYSICIAN:  Excell Seltzer. Annabell Howells, M.D.    DATE OF BIRTH:  04-19-1960  DATE OF PROCEDURE:  01/07/2013 DATE OF DISCHARGE:                              OPERATIVE REPORT   PROCEDURE:  Cystoscopy with hydrodistention of the bladder, installation of Pyridium and Marcaine.  PREOPERATIVE DIAGNOSIS:  Interstitial cystitis.  POSTOPERATIVE DIAGNOSIS:  Interstitial cystitis.  SURGEON:  Excell Seltzer. Annabell Howells, M.D.  ANESTHESIA:  General.  SPECIMEN:  None.  DRAINS:  None.  COMPLICATIONS:  None.  INDICATIONS:  [name] is a 52 year old white female with history of interstitial cystitis, who has had progressive symptoms, she has responded to hydrodistention in the past and it was felt this needed to be repeated.  FINDINGS OF PROCEDURE:  She was taken to the operating room where she was given Cipro.  A general anesthetic was induced.  She was fitted with PAS hose and placed in lithotomy position.  Her perineum and genitalia were prepped with Betadine solution, and she was draped in usual sterile fashion.  Cystoscopy was performed using the 22-French scope and 12-degree lens. Examination revealed a normal urethra.  The bladder wall had mild trabeculation and the mucosa was pale without tumor, stones, or inflammation.  The ureteral orifices, efflux and clear urine were in the normal anatomic position.  Once thorough cystoscopy had been performed, the bladder was dilated to capacity under 80 cm of water pressure and then drained.  The capacity under anesthesia was 1100 mL.  The terminal efflux was clear, but there were a few scattered glomerulations consistent with interstitial cystitis in the area of the trigone.  Once secondary inspection had been performed, the bladder was drained. A 14-French red rubber catheter was placed and the bladder was  instilled with 30 mL of 0.25% Marcaine with 400 mg crushed Pyridium.  The catheter was removed.  A B and O suppository was placed.  The drapes were removed.  The patient was taken down from lithotomy position.  Her anesthetic was reversed.  She was moved to the recovery room in stable condition.  There were no complications.     Excell Seltzer. Annabell Howells, M.D.     JJW/MEDQ  D:  01/07/2013  T:  01/08/2013  Job:  [id number]

## 2017-06-18 ENCOUNTER — Encounter: Payer: Self-pay | Admitting: Internal Medicine

## 2017-08-23 ENCOUNTER — Ambulatory Visit: Payer: Managed Care, Other (non HMO) | Admitting: Internal Medicine

## 2017-11-16 ENCOUNTER — Ambulatory Visit (INDEPENDENT_AMBULATORY_CARE_PROVIDER_SITE_OTHER): Payer: 59 | Admitting: Internal Medicine

## 2017-11-16 ENCOUNTER — Encounter: Payer: Self-pay | Admitting: Internal Medicine

## 2017-11-16 ENCOUNTER — Telehealth: Payer: Self-pay

## 2017-11-16 VITALS — BP 116/70 | HR 67 | Ht 66.0 in | Wt 169.0 lb

## 2017-11-16 DIAGNOSIS — E236 Other disorders of pituitary gland: Secondary | ICD-10-CM | POA: Diagnosis not present

## 2017-11-16 DIAGNOSIS — E039 Hypothyroidism, unspecified: Secondary | ICD-10-CM | POA: Insufficient documentation

## 2017-11-16 MED ORDER — LEVOTHYROXINE SODIUM 137 MCG PO TABS: 137.0000 ug | ORAL_TABLET | Freq: Every day | ORAL | 3 refills | Status: DC

## 2017-11-16 NOTE — Progress Notes (Signed)
Patient ID: Kristin [name], female   DOB: Jun 12, 1960, 57 y.o.   MRN: [id number]    HPI  Kristin [name] is a 57 y.o.-year-old female, referred by her PCP, Bulla, Dorinda Hill, PA-C, for management of uncontrolled hypothyroidism. Her adopted son is Kristin [name] (also my pt).  Pt. has been dx with hypothyroidism 20 years ago >> on Levothyroxine 137 mcg (for a long time).  She takes the thyroid hormone: - at night! (11 pm), snack around the same time - with water - + coffee + cream, milk - separated by >30 min from b'fast  - no calcium, iron, MVI - + Omeprazole 40 mg daily in am  I reviewed pt's thyroid tests: 01/21/2016: TSH 8.9 No results found for: TSH, FREET4, T3FREE  Antithyroid antibodies: No results found for: THGAB No components found for: TPOAB  Pt describes: - no weight gain - + hot flushes - + fatigue - no cold intolerance - + depression - no constipation - + hair loss  Pt denies feeling nodules in neck,dysphagia/odynophagia, SOB with lying down, but does have Hoarseness.   She has no known FH of thyroid disorders (only Graves ds. in adopted son). No FH of thyroid cancer.  No h/o radiation tx to head or neck. No recent use of iodine supplements.  Pt. also has a history of PUD, back pain, fibromyalgia, GERD, Kristin, interstitial cystitis. She also has a h/o back sx 2017 and 2 back surgeries planned.   ROS: Constitutional: + see HPI, + nocturia (interstitial cystitis) Eyes: no blurry vision, no xerophthalmia ENT: no sore throat, + tinnitus, + hypoacusis Cardiovascular: no CP/SOB/palpitations/leg swelling Respiratory: no cough/SOB Gastrointestinal: + N/V/acid reflux, no D/ no C Musculoskeletal: + both: muscle/joint aches Skin: no rashes, + hair loss Neurological: no tremors/numbness/tingling/dizziness Psychiatric: + depression/no anxiety + low libido  Past Medical History:  Diagnosis Date  . Anxiety   . Asthma   . Depression   . Fibromyalgia   . GERD  (gastroesophageal reflux disease)   . Hypothyroidism   . IC (interstitial cystitis)   . Migraine   . TMJ (temporomandibular joint disorder)    Past Surgical History:  Procedure Laterality Date  . ABDOMINAL HYSTERECTOMY  2005   W/ RIGHT SALPINGOOPHORECTOMY  . ANTERIOR CERVICAL DECOMP/DISCECTOMY FUSION  11-16-2009   C5  ---  C7  . CARDIAC [place]HETERIZATION  01-22-2006  [place]   NORMAL CORONARY ARTERIES/ NORMAL LVSF/  EF 55-60% /  MILD TO MODERATE DILATATION OF ASCENDING AORTA  . CYSTO WITH HYDRODISTENSION N/A 01/07/2013   Procedure: CYSTOSCOPY/HYDRODISTENSION, INSTILL MARCAINE AND PYRIDIUM;  Surgeon: Bjorn Pippin, MD;  Location: [facility];  Service: Urology;  Laterality: N/A;  . CYSTO/  URETHRAL DILATION/  HYDRODISTENTION  12-17-2001  . NASAL SINUS SURGERY  1994  . TRANSTHORACIC ECHOCARDIOGRAM  09-08-2008   BORDERLINE LVH AND DIASTOLIC DYSFUNCTION/  EF 55%/  MILD AI   Social History   Socioeconomic History  . Marital status: Married    Spouse name: Not on file  . Number of children: Not on file  . Years of education: Not on file  . Highest education level: Not on file  Occupational History  . Not on file  Social Needs  . Financial resource strain: Not on file  . Food insecurity:    Worry: Not on file    Inability: Not on file  . Transportation needs:    Medical: Not on file    Non-medical: Not on file  Tobacco Use  . Smoking status:  Current Every Day Smoker    Packs/day: 1.50    Years: 11.00    Pack years: 16.50    Types: Cigarettes  . Smokeless tobacco: Never Used  Substance and Sexual Activity  . Alcohol use: No  . Drug use: No  . Sexual activity: Not on file  Lifestyle  . Physical activity:    Days per week: Not on file    Minutes per session: Not on file  . Stress: Not on file  Relationships  . Social connections:    Talks on phone: Not on file    Gets together: Not on file    Attends religious service: Not on file    Active member of club  or organization: Not on file    Attends meetings of clubs or organizations: Not on file    Relationship status: Not on file  . Intimate partner violence:    Fear of current or ex partner: Not on file    Emotionally abused: Not on file    Physically abused: Not on file    Forced sexual activity: Not on file  Other Topics Concern  . Not on file  Social History Narrative  . Not on file   Current Outpatient Medications on File Prior to Visit  Medication Sig Dispense Refill  . Buprenorphine HCl-Naloxone HCl 8-2 MG FILM     . escitalopram (LEXAPRO) 20 MG tablet Take 20 mg by mouth daily.    Marland Kitchen ibuprofen (ADVIL,MOTRIN) 200 MG tablet Take 200 mg by mouth every 6 (six) hours as needed.    Marland Kitchen tiZANidine (ZANAFLEX) 4 MG tablet     . ALBUTEROL IN Inhale into the lungs as needed.    . baclofen (LIORESAL) 10 MG tablet Take 10-20 mg by mouth at bedtime.    . famotidine (PEPCID) 40 MG tablet Take 40 mg by mouth at bedtime.    Marland Kitchen HYDROcodone-acetaminophen (NORCO) 7.5-325 MG per tablet Take 1 tablet by mouth every 6 (six) hours as needed for moderate pain.    Marland Kitchen lamoTRIgine (LAMICTAL) 100 MG tablet Take 100 mg by mouth at bedtime.    . Melatonin 5 MG CAPS Take by mouth at bedtime.    . Multiple Vitamin (MULTIVITAMIN) tablet Take 1 tablet by mouth daily.    . naproxen (NAPROSYN) 250 MG tablet Take 250 mg by mouth as needed.    . phenazopyridine (PYRIDIUM) 200 MG tablet Take 1 tablet (200 mg total) by mouth 3 (three) times daily as needed for pain. (Patient not taking: Reported on 11/16/2017) 30 tablet 1  . topiramate (TOPAMAX) 100 MG tablet Take 300 mg by mouth at bedtime.     Current Facility-Administered Medications on File Prior to Visit  Medication Dose Route Frequency Provider Last Rate Last Dose  . bupivacaine (MARCAINE) 0.5 % 15 mL, phenazopyridine (PYRIDIUM) 400 mg bladder mixture   Bladder Instillation Once Bjorn Pippin, MD       No Known Allergies   FH: - Herat ds in [name], Kristin [name] - Kristin [name] - Kristin in  P grandparents  PE: BP 116/70   Pulse 67   Ht 5\' 6"  (1.676 [name])   Wt 169 lb (76.7 kg)   SpO2 97%   BMI 27.28 kg/[name]  Wt Readings from Last 3 Encounters:  11/16/17 169 lb (76.7 kg)  01/07/13 173 lb 8 oz (78.7 kg)   Constitutional: slightly overweight, in NAD Eyes: PERRLA, EOMI, no exophthalmos ENT: moist mucous membranes, no thyromegaly, no cervical lymphadenopathy Cardiovascular: RRR, No  MRG Respiratory: CTA B Gastrointestinal: abdomen soft, NT, ND, BS+ Musculoskeletal: no deformities, strength intact in all 4 Skin: moist, warm, no rashes Neurological: no tremor with outstretched hands, DTR normal in all 4  ASSESSMENT: 1. Hypothyroidism  2.  Empty sella  PLAN:  1. Patient with long-standing uncontrolled hypothyroidism, on levothyroxine therapy. Latest TSH was reviewed and this was elevated, but I only have the results from 01/2016 - she appears euthyroid, with several complaints, possibly related to under/over replacement with thyroid hormones: Fatigue, hot flashes, generalized pain, hair loss, depression. She is mostly bothered by her hot flashes, which are disturbing her life.  We discussed that she may need to see OB/GYN for possible low-dose hormone replacement therapy.  However, she is telling me that she is taking the levothyroxine at night, which may be related to insomnia and heat intolerance during the night.  I advised her to move the levothyroxine in the morning, fasting, 30 minutes before coffee especially since she adds dairy to this, and also 30 minutes before breakfast.  In the meantime, she needs to move her omeprazole later in the day, possibly at dinnertime. - she does not appear to have a goiter, thyroid nodules, or neck compression symptoms - She is on levothyroxine 137 mcg daily "forever".  For now, we will continue this dose as we will switch the levothyroxine timing. - We discussed with her and her husband about correct intake of levothyroxine, fasting, with  water, separated by at least 30 minutes from breakfast, and separated by more than 4 hours from calcium, iron, multivitamins, acid reflux medications (PPIs). - After she starts taking the levothyroxine correctly, I will have her return in 5 weeks to check TFTs, but also to screen her for Hashimoto's thyroiditis by checking her TPO and ATA antibodies - I will see her back in 4 months  3.  Empty sella -At this visit, we discussed about the nature of the problem. I explained the importance of the pituitary gland and the fact that this is pushed on the side of the sella, hence the misnomer "empty sella". We discussed about the fact that we need to make sure that her pituitary is functioning right, as this is the endocrine controller in the body.  - I will check her pituitary-adrenal and pituitary-thyroid axes, to make sure that she does not need replacement.  - since the patient does not have a history of pituitary masses, it is very unlikely that her empty sella is secondary to a previous pituitary tumor, therefore, it can be qualified as primary empty sella. This is rarely associated with pituitary abnormalities, but we do need to make sure that the adrenal and pituitary function is normal. - I advised the patient to return to have labs drawn at 8 AM, and I will let her know the results. If the labs are normal, no intervention is necessary.  We will check these labs in 5 weeks when she returns for the thyroid tests  Orders Placed This Encounter  Procedures  . TSH  . T4, free  . T3, free  . Thyroglobulin antibody  . Thyroid peroxidase antibody  . Prolactin  . Cortisol  . Follicle stimulating hormone  . Luteinizing hormone  . ACTH  . Insulin-like growth factor    Carlus Pavlov, MD PhD [facility] Endocrinology

## 2017-11-16 NOTE — Patient Instructions (Addendum)
Please come back for labs in 5 weeks. Please come back between 8-9 am, fasting.  Please continue: - Levothyroxine 137 mcg daily  Move this to am, fasting. Move Omeprazole at dinnertime.  Take the thyroid hormone every day, with water, at least 30 minutes before breakfast, separated by at least 4 hours from: - acid reflux medications - calcium - iron - multivitamins  Please return in 4 months.

## 2017-11-16 NOTE — Telephone Encounter (Signed)
Contacted patient's PCP for last thyroid results, they require [place] to fax them the request.  Request faxed.

## 2017-12-21 ENCOUNTER — Other Ambulatory Visit: Payer: 59

## 2018-03-19 ENCOUNTER — Ambulatory Visit: Payer: 59 | Admitting: Internal Medicine

## 2018-06-21 ENCOUNTER — Ambulatory Visit: Payer: 59 | Admitting: Internal Medicine

## 2018-07-04 ENCOUNTER — Telehealth: Payer: Self-pay

## 2018-07-04 NOTE — Telephone Encounter (Signed)
Opened in error

## 2018-08-01 ENCOUNTER — Ambulatory Visit: Payer: 59 | Admitting: Internal Medicine

## 2019-02-14 ENCOUNTER — Other Ambulatory Visit: Payer: Self-pay | Admitting: Internal Medicine

## 2019-02-14 NOTE — Telephone Encounter (Signed)
She canceled or no showed all of her appointments with me.  Therefore, refills per PCP.

## 2019-02-14 NOTE — Telephone Encounter (Signed)
Last office visit 11/16/2017  Cancel/No-show? none  Future office visit scheduled? no  Please advise on refill.

## 2019-03-11 ENCOUNTER — Ambulatory Visit (INDEPENDENT_AMBULATORY_CARE_PROVIDER_SITE_OTHER): Payer: BC Managed Care – PPO | Admitting: Internal Medicine

## 2019-03-11 ENCOUNTER — Encounter: Payer: Self-pay | Admitting: Internal Medicine

## 2019-03-11 ENCOUNTER — Other Ambulatory Visit: Payer: Self-pay

## 2019-03-11 DIAGNOSIS — E039 Hypothyroidism, unspecified: Secondary | ICD-10-CM

## 2019-03-11 DIAGNOSIS — E236 Other disorders of pituitary gland: Secondary | ICD-10-CM

## 2019-03-11 NOTE — Progress Notes (Addendum)
Patient ID: Kristin [name], female   DOB: 10-09-1960, 59 y.o.   MRN: [id number]   Patient location: Home My location: Office Persons participating in the virtual visit: patient, provider  Referring Provider: Doreen Salvage, PA-C  I connected with the patient on 03/11/19 at  3:40 PM EST by a video enabled telemedicine application and verified that I am speaking with the correct person.   I discussed the limitations of evaluation and management by telemedicine and the availability of in person appointments. The patient expressed understanding and agreed to proceed.   Details of the encounter are shown below.  HPI  Kristin [name] is a 59 y.o.-year-old female, initially referred by her PCP, Bulla, Dorinda Hill, PA-C, returning for follow-up for uncontrolled hypothyroidism and empty sella. Her adopted son is [name] (also my pt).  I first saw the patient in 11/2017 and she was lost for follow-up afterwards as she had 4 back surgeries after this and she had problems with mobility.  She was diagnosed with hypothyroidism in ~2000 >> on levothyroxine 137 mcg for a long time.  At last visit, she was taking levothyroxine at night around the time of the snack and omeprazole in the morning.  We will levothyroxine the morning and omeprazole at night.  I advised her to come back for labs after the change, but she did not return for the above reason.  Pt continues on levothyroxine 137 mcg daily: - in am - + coffee + cream 30 min later most of the time - fasting - at least 30 min from b'fast - no Ca, Fe, MVI, + PPIs at night (Omeprazole) - not consistently - not on Biotin  Reviewed patient's TFTs: 01/21/2016: TSH 8.9 No results found for: TSH, FREET4, T3FREE  Antithyroid antibodies: No results found for: THGAB No components found for: TPOAB   Pt denies: - feeling nodules in neck - hoarseness - choking - SOB with lying down She started Vraylar for depression. She has some swallowing pbs after she  started this.  She has no known FH of thyroid disorders (only Graves ds. in adopted son). No FH of thyroid cancer. No h/o radiation tx to head or neck.  No herbal supplements. No Biotin use. No recent steroids use.   Pt. also has a history of PUD, back pain, interstitial cystitis, fibromyalgia, GERD, HL. She has a history of back surgery.  She also has a history of empty sella, which she plans to investigate at last visit but she did not return for these labs, either.  ROS: Constitutional: + weight gain/no weight loss, + fatigue, + subjective hyperthermia, no subjective hypothermia, + nocturia Eyes: no blurry vision, no xerophthalmia ENT: no sore throat, + see HPI, + tinnitus, + hyperacusis Cardiovascular: no CP/no SOB/no palpitations/no leg swelling Respiratory: no cough/no SOB/no wheezing Gastrointestinal: no N/no V/no D/no C/no acid reflux Musculoskeletal: + Muscle aches/+ joint aches Skin: no rashes, + hair loss Neurological: no tremors/no numbness/no tingling/no dizziness  I reviewed pt's medications, allergies, PMH, social hx, family hx, and changes were documented in the history of present illness. Otherwise, unchanged from my initial visit note.  Past Medical History:  Diagnosis Date  . Anxiety   . Asthma   . Depression   . Fibromyalgia   . GERD (gastroesophageal reflux disease)   . Hypothyroidism   . IC (interstitial cystitis)   . Migraine   . TMJ (temporomandibular joint disorder)    Past Surgical History:  Procedure Laterality Date  . ABDOMINAL HYSTERECTOMY  2005  W/ RIGHT SALPINGOOPHORECTOMY  . ANTERIOR CERVICAL DECOMP/DISCECTOMY FUSION  11-16-2009   C5  ---  C7  . CARDIAC CATHETERIZATION  01-22-2006  [place]   NORMAL CORONARY ARTERIES/ NORMAL LVSF/  EF 55-60% /  MILD TO MODERATE DILATATION OF ASCENDING AORTA  . CYSTO WITH HYDRODISTENSION N/A 01/07/2013   Procedure: CYSTOSCOPY/HYDRODISTENSION, INSTILL MARCAINE AND PYRIDIUM;  Surgeon: Bjorn Pippin, MD;   Location: [facility];  Service: Urology;  Laterality: N/A;  . CYSTO/  URETHRAL DILATION/  HYDRODISTENTION  12-17-2001  . NASAL SINUS SURGERY  1994  . TRANSTHORACIC ECHOCARDIOGRAM  09-08-2008   BORDERLINE LVH AND DIASTOLIC DYSFUNCTION/  EF 55%/  MILD AI   Social History   Socioeconomic History  . Marital status: Married    Spouse name: Not on file  . Number of children: Not on file  . Years of education: Not on file  . Highest education level: Not on file  Occupational History  . Not on file  Tobacco Use  . Smoking status: Current Every Day Smoker    Packs/day: 1.50    Years: 11.00    Pack years: 16.50    Types: Cigarettes  . Smokeless tobacco: Never Used  Substance and Sexual Activity  . Alcohol use: No  . Drug use: No  . Sexual activity: Not on file  Other Topics Concern  . Not on file  Social History Narrative  . Not on file   Social Determinants of Health   Financial Resource Strain:   . Difficulty of Paying Living Expenses: Not on file  Food Insecurity:   . Worried About Programme researcher, broadcasting/film/video in the Last Year: Not on file  . Ran Out of Food in the Last Year: Not on file  Transportation Needs:   . Lack of Transportation (Medical): Not on file  . Lack of Transportation (Non-Medical): Not on file  Physical Activity:   . Days of Exercise per Week: Not on file  . Minutes of Exercise per Session: Not on file  Stress:   . Feeling of Stress : Not on file  Social Connections:   . Frequency of Communication with Friends and Family: Not on file  . Frequency of Social Gatherings with Friends and Family: Not on file  . Attends Religious Services: Not on file  . Active Member of Clubs or Organizations: Not on file  . Attends Banker Meetings: Not on file  . Marital Status: Not on file  Intimate Partner Violence:   . Fear of Current or Ex-Partner: Not on file  . Emotionally Abused: Not on file  . Physically Abused: Not on file  . Sexually  Abused: Not on file   Current Outpatient Medications on File Prior to Visit  Medication Sig Dispense Refill  . ALBUTEROL IN Inhale into the lungs as needed.    . baclofen (LIORESAL) 10 MG tablet Take 10-20 mg by mouth at bedtime.    . Buprenorphine HCl-Naloxone HCl 8-2 MG FILM     . escitalopram (LEXAPRO) 20 MG tablet Take 20 mg by mouth daily.    Marland Kitchen HYDROcodone-acetaminophen (NORCO) 7.5-325 MG per tablet Take 1 tablet by mouth every 6 (six) hours as needed for moderate pain.    Marland Kitchen ibuprofen (ADVIL,MOTRIN) 200 MG tablet Take 200 mg by mouth every 6 (six) hours as needed.    . lamoTRIgine (LAMICTAL) 100 MG tablet Take 100 mg by mouth at bedtime.    Marland Kitchen levothyroxine (SYNTHROID, LEVOTHROID) 137 MCG tablet Take 1 tablet (  137 mcg total) by mouth daily before breakfast. 90 tablet 3  . Melatonin 5 MG CAPS Take by mouth at bedtime.    . Multiple Vitamin (MULTIVITAMIN) tablet Take 1 tablet by mouth daily.    . naproxen (NAPROSYN) 250 MG tablet Take 250 mg by mouth as needed.    . phenazopyridine (PYRIDIUM) 200 MG tablet Take 1 tablet (200 mg total) by mouth 3 (three) times daily as needed for pain. (Patient not taking: Reported on 11/16/2017) 30 tablet 1  . tiZANidine (ZANAFLEX) 4 MG tablet     . topiramate (TOPAMAX) 100 MG tablet Take 300 mg by mouth at bedtime.     Current Facility-Administered Medications on File Prior to Visit  Medication Dose Route Frequency Provider Last Rate Last Admin  . bupivacaine (MARCAINE) 0.5 % 15 mL, phenazopyridine (PYRIDIUM) 400 mg bladder mixture   Bladder Instillation Once Irine Seal, MD       No Known Allergies   FH: - Herat ds in M, GM - HL in M - DM2 in P grandparents  PE: There were no vitals taken for this visit. Wt Readings from Last 3 Encounters:  11/16/17 169 lb (76.7 kg)  01/07/13 173 lb 8 oz (78.7 kg)   Constitutional:  in NAD  The physical exam was not performed (virtual visit).  ASSESSMENT: 1. Hypothyroidism  2.  Empty sella  PLAN:  1.  Patient with longstanding, uncontrolled hypothyroidism, on levothyroxine therapy.  The latest TSH available for review was elevated, in 01/2016.  At last visit, she appears euthyroid but had several complaints, fatigue, hot flashes, generalized pain, hair loss, depression.  We discussed about seeing OB/GYN for possible low-dose HRT. -At last visit, she was taking levothyroxine at night and I strongly advised her to move it in the morning, fasting and return for labs (TFTs, thyroid antibodies) in 1.5 months after the change.  She did not return for these because of her back surgeries and immobilization.  At this visit, which is virtual, we discussed about the need to recheck her TFTs,  otherwise I cannot manage her hypothyroidism. - she continues on LT4 137 mcg daily - we discussed about taking the thyroid hormone every day, with water, >30 minutes before breakfast, separated by >4 hours from acid reflux medications, calcium, iron, multivitamins. Pt. is taking it correctly now. - will check thyroid tests when she returns to the clinic: TSH and fT4 - If labs are abnormal, she will need to return for repeat TFTs in 1.5 months - I will see her back in 6 months  3.  Empty sella -Since she does not have a history of pituitary masses, it is very unlikely that her empty sella is secondary to previous pituitary tumor, therefore, it can be qualified as primary empty sella.  This is rarely associated with pituitary abnormalities -At last visit, we discussed about the importance of the pituitary gland and the fact that empty sella is referring to the increased fluid in the sella that pushes the pituitary gland on the sides of the sella.  We we will need to check her pituitary hormones to make sure that they are normal.  I advised her to come in for labs at 8 AM, fasting.  Orders Placed This Encounter  Procedures  . TSH  . T4, free  . Follicle stimulating hormone  . Luteinizing hormone  . Insulin-like growth  factor  . Prolactin  . Cortisol  . ACTH   Component     Latest Ref  Rng & Units 03/18/2019  IGF-I, LC/MS     50 - 317 ng/mL 137  Z-Score (Female)     -2.0 - 2 SD 0.0  C206 ACTH     6 - 50 pg/mL <5 (L)  Cortisol, Plasma     ug/dL 7.0  Prolactin     ng/mL 6.7  LH     mIU/mL 7.90  FSH     mIU/ML 30.8  T4,Free(Direct)     0.60 - 1.60 ng/dL 1.61  TSH     0.96 - 0.45 uIU/mL 0.24 (L)   Cortisol is normal but ACTH is low.  This can be due to problems with manipulation of the ACTH sample.  I would like to repeat the cortisol and ACTH at 8 AM, at our lab in 5 weeks. TSH is slightly suppressed.  Will advise her to reduce the levothyroxine dose to 125 mcg daily and will repeat the tests when she returns for cortisol.  Carlus Pavlov, MD PhD [facility] Endocrinology

## 2019-03-11 NOTE — Patient Instructions (Signed)
Please come back for labs at your convenience, at 8 AM, fasting.    Please continue levothyroxine 137 mcg daily.  Take the thyroid hormone every day, with water, at least 30 minutes before breakfast, separated by at least 4 hours from: - acid reflux medications - calcium - iron - multivitamins  Please come back for a follow-up appointment in 6 months.

## 2019-03-18 ENCOUNTER — Other Ambulatory Visit (INDEPENDENT_AMBULATORY_CARE_PROVIDER_SITE_OTHER): Payer: BC Managed Care – PPO

## 2019-03-18 ENCOUNTER — Other Ambulatory Visit: Payer: Self-pay

## 2019-03-18 DIAGNOSIS — E039 Hypothyroidism, unspecified: Secondary | ICD-10-CM

## 2019-03-18 DIAGNOSIS — E236 Other disorders of pituitary gland: Secondary | ICD-10-CM | POA: Diagnosis not present

## 2019-03-18 LAB — T4, FREE: Free T4: 1.08 ng/dL (ref 0.60–1.60)

## 2019-03-18 LAB — LUTEINIZING HORMONE: LH: 7.9 m[IU]/mL

## 2019-03-18 LAB — CORTISOL: Cortisol, Plasma: 7 ug/dL

## 2019-03-18 LAB — TSH: TSH: 0.24 u[IU]/mL — ABNORMAL LOW (ref 0.35–4.50)

## 2019-03-18 LAB — FOLLICLE STIMULATING HORMONE: FSH: 30.8 m[IU]/mL

## 2019-03-23 LAB — INSULIN-LIKE GROWTH FACTOR
IGF-I, LC/MS: 137 ng/mL (ref 50–317)
Z-Score (Female): 0 SD (ref ?–2.0)

## 2019-03-23 LAB — EXTRA SPECIMEN

## 2019-03-23 LAB — ACTH: C206 ACTH: <5 pg/mL — ABNORMAL LOW (ref 6–50)

## 2019-03-23 LAB — PROLACTIN: Prolactin: 6.7 ng/mL

## 2019-03-24 MED ORDER — LEVOTHYROXINE SODIUM 125 MCG PO TABS: 137.0000 ug | ORAL_TABLET | Freq: Every day | ORAL | 3 refills | Status: DC

## 2019-03-24 NOTE — Addendum Note (Signed)
Addended by: Carlus Pavlov on: 03/24/2019 05:31 PM   Modules accepted: Orders

## 2019-04-18 ENCOUNTER — Other Ambulatory Visit: Payer: Self-pay | Admitting: Internal Medicine

## 2020-01-08 ENCOUNTER — Other Ambulatory Visit: Payer: Self-pay | Admitting: Internal Medicine

## 2020-03-31 ENCOUNTER — Other Ambulatory Visit: Payer: Self-pay | Admitting: Internal Medicine

## 2020-07-02 ENCOUNTER — Other Ambulatory Visit: Payer: Self-pay | Admitting: Internal Medicine

## 2020-09-27 ENCOUNTER — Other Ambulatory Visit: Payer: Self-pay | Admitting: Endocrinology

## 2020-12-07 ENCOUNTER — Ambulatory Visit: Payer: BC Managed Care – PPO | Admitting: Internal Medicine

## 2021-09-23 ENCOUNTER — Other Ambulatory Visit: Payer: Self-pay | Admitting: Endocrinology

## 2021-09-26 ENCOUNTER — Other Ambulatory Visit: Payer: Self-pay | Admitting: Internal Medicine

## 2021-10-19 ENCOUNTER — Other Ambulatory Visit: Payer: Self-pay | Admitting: Internal Medicine

## 2021-11-15 ENCOUNTER — Other Ambulatory Visit: Payer: Self-pay | Admitting: Internal Medicine

## 2021-12-12 ENCOUNTER — Ambulatory Visit (INDEPENDENT_AMBULATORY_CARE_PROVIDER_SITE_OTHER): Payer: BC Managed Care – PPO | Admitting: Internal Medicine

## 2021-12-12 ENCOUNTER — Encounter: Payer: Self-pay | Admitting: Internal Medicine

## 2021-12-12 VITALS — BP 144/80 | HR 87 | Ht 66.0 in | Wt 204.0 lb

## 2021-12-12 DIAGNOSIS — E039 Hypothyroidism, unspecified: Secondary | ICD-10-CM | POA: Diagnosis not present

## 2021-12-12 DIAGNOSIS — E236 Other disorders of pituitary gland: Secondary | ICD-10-CM | POA: Diagnosis not present

## 2021-12-12 NOTE — Progress Notes (Signed)
Patient ID: Kristin [name], female   DOB: January 18, 1961, 61 y.o.   MRN: [id number]   HPI  Kristin [name] is a 61 y.o.-year-old female, initially referred by her PCP, Bull[place], Elenore Rota, PA-C, returning for follow-up for uncontrolled hypothyroidism and empty sella. Her adopted son is Baron Hamper (also my pt).  Last visit 2 years and 9 months ago, previous visit 2 years prior.  Interim history: He had several more surgeries since last visit including back surgeries and oophorectomy. She has bothersome hot flushes. She is off and on HRT. Will restart Estrogen and Testosterone -waiting for them to arrive in the mail.  Reviewed history: She was diagnosed with hypothyroidism in ~2000 >> on levothyroxine 137 mcg for a long time.  At last visit, she was taking levothyroxine at night around the time of the snack and omeprazole in the morning.  We will levothyroxine the morning and omeprazole at night.  I advised her to come back for labs after the change, but she did not return for the above reason.  Pt continues on levothyroxine 125 mcg daily (dose decreased 03/2019): - at 1-2 pm after other meds - + Omeprazole and allergy meds first thing in am - fasting - at least 30 min from b'fast - no Ca, Fe, MVI - not on Biotin  Reviewed patient's TFTs: Lab Results  Component Value Date   TSH 0.24 (L) 03/18/2019   FREET4 1.08 03/18/2019  01/21/2016: TSH 8.9  Antithyroid antibodies: No results found for: "THGAB" No components found for: "TPOAB"   Pt denies: - feeling nodules in neck - hoarseness - choking  She has no known FH of thyroid disorders (only Graves ds. in adopted son). No FH of thyroid cancer. No h/o radiation tx to head or neck. No herbal supplements. No Biotin use. No recent steroids use.   Pt. also has a history of PUD, back pain, interstitial cystitis, fibromyalgia, GERD, HL. She has a history of back surgery.  She also has a history of empty sella:  Previous hormonal investigations  showed: Component     Latest Ref Rng & Units 03/18/2019  IGF-I, LC/MS     50 - 317 ng/mL 137  Z-Score (Female)     -2.0 - 2 SD 0.0  C206 ACTH     6 - 50 pg/mL <5 (L)  Cortisol, Plasma     ug/dL 7.0  Prolactin     ng/mL 6.7  LH     mIU/mL 7.90  FSH     mIU/ML 30.8  T4,Free(Direct)     0.60 - 1.60 ng/dL 1.08  TSH     0.35 - 4.50 uIU/mL 0.24 (L)  I advised her to return for repeat ACTH and cortisol but she did not do so.  ROS: + See HPI  I reviewed pt's medications, allergies, PMH, social hx, family hx, and changes were documented in the history of present illness. Otherwise, unchanged from my initial visit note.  Past Medical History:  Diagnosis Date   Anxiety    Asthma    Depression    Fibromyalgia    GERD (gastroesophageal reflux disease)    Hypothyroidism    IC (interstitial cystitis)    Migraine    TMJ (temporomandibular joint disorder)    Past Surgical History:  Procedure Laterality Date   ABDOMINAL HYSTERECTOMY  2005   W/ RIGHT SALPINGOOPHORECTOMY   ANTERIOR CERVICAL DECOMP/DISCECTOMY FUSION  11-16-2009   C5  ---  C7   C[place]22-2006  [place]  NORMAL CORONARY ARTERIES/ NORMAL LVSF/  EF 55-60% /  MILD TO MODERATE DILATATION OF ASCENDING AORTA   CYSTO WITH HYDRODISTENSION N/A 01/07/2013   Procedure: CYSTOSCOPY/HYDRODISTENSION, INSTILL MARCAINE AND PYRIDIUM;  Surgeon: Bjorn Pippin, MD;  Location: [facility];  Service: Urology;  Laterality: N/A;   CYSTO/  URETHRAL DILATION/  HYDRODISTENTION  12-17-2001   NASAL SINUS SURGERY  1994   TRANSTHORACIC ECHOCARDIOGRAM  09-08-2008   BORDERLINE LVH AND DIASTOLIC DYSFUNCTION/  EF 55%/  MILD AI   Social History   Socioeconomic History   Marital status: Married    Spouse name: Not on file   Number of children: Not on file   Years of education: Not on file   Highest education level: Not on file  Occupational History   Not on file  Tobacco Use   Smoking status: Every Day     Packs/day: 1.50    Years: 11.00    Total pack years: 16.50    Types: Cigarettes   Smokeless tobacco: Never  Substance and Sexual Activity   Alcohol use: No   Drug use: No   Sexual activity: Not on file  Other Topics Concern   Not on file  Social History Narrative   Not on file   Social Determinants of Health   Financial Resource Strain: Not on file  Food Insecurity: Not on file  Transportation Needs: Not on file  Physical Activity: Not on file  Stress: Not on file  Social Connections: Not on file  Intimate Partner Violence: Not on file   Current Outpatient Medications on File Prior to Visit  Medication Sig Dispense Refill   ALBUTEROL IN Inhale into the lungs as needed.     baclofen (LIORESAL) 10 MG tablet Take 10-20 mg by mouth at bedtime.     Buprenorphine HCl-Naloxone HCl 8-2 MG FILM      escitalopram (LEXAPRO) 20 MG tablet Take 20 mg by mouth daily.     HYDROcodone-acetaminophen (NORCO) 7.5-325 MG per tablet Take 1 tablet by mouth every 6 (six) hours as needed for moderate pain.     ibuprofen (ADVIL,MOTRIN) 200 MG tablet Take 200 mg by mouth every 6 (six) hours as needed.     lamoTRIgine (LAMICTAL) 100 MG tablet Take 100 mg by mouth at bedtime.     levothyroxine (SYNTHROID) 125 MCG tablet TAKE 1 TABLET BY MOUTH EVERY DAY BEFORE BREAKFAST 90 tablet 1   Melatonin 5 MG CAPS Take by mouth at bedtime.     Multiple Vitamin (MULTIVITAMIN) tablet Take 1 tablet by mouth daily.     naproxen (NAPROSYN) 250 MG tablet Take 250 mg by mouth as needed.     phenazopyridine (PYRIDIUM) 200 MG tablet Take 1 tablet (200 mg total) by mouth 3 (three) times daily as needed for pain. (Patient not taking: Reported on 11/16/2017) 30 tablet 1   tiZANidine (ZANAFLEX) 4 MG tablet      topiramate (TOPAMAX) 100 MG tablet Take 300 mg by mouth at bedtime.     Current Facility-Administered Medications on File Prior to Visit  Medication Dose Route Frequency Provider Last Rate Last Admin   bupivacaine  (MARCAINE) 0.5 % 15 mL, phenazopyridine (PYRIDIUM) 400 mg bladder mixture   Bladder Instillation Once Bjorn Pippin, MD       No Known Allergies   FH: - Herat ds in M, GM - HL in M - DM2 in P grandparents  PE: BP (!) 144/80 (BP Location: Right Arm, Patient Position: Sitting, Cuff Size: Normal)  Pulse 87   Ht 5\' 6"  (1.676 m)   Wt 204 lb (92.5 kg)   SpO2 99%   BMI 32.93 kg/m  Wt Readings from Last 3 Encounters:  12/12/21 204 lb (92.5 kg)  11/16/17 169 lb (76.7 kg)  01/07/13 173 lb 8 oz (78.7 kg)   Constitutional: overweight, in NAD Eyes:  EOMI, no exophthalmos ENT: no neck masses, no cervical lymphadenopathy Cardiovascular: RRR, No MRG Respiratory: CTA B Musculoskeletal: no deformities Skin:no rashes Neurological: no tremor with outstretched hands  ASSESSMENT: 1. Hypothyroidism  2.  Empty sella  PLAN:  1. Patient with longstanding, uncontrolled hypothyroidism, on levothyroxine therapy.  She returns after long absence of 2 years and 9 months, previous visit 2 years prior. -She was previous taking levothyroxine at night so we moved into the morning - latest thyroid labs reviewed with pt. >> TSH was slightly low so I advised her to return for repeat after reducing the levothyroxine dose - she did not return... Lab Results  Component Value Date   TSH 0.24 (L) 03/18/2019  - she continues on LT4 125 mcg daily - pt feels good on this dose, but is very bothered by the hot flashes.  She also gained approximately 30 pounds since last in person visit - we discussed about taking the thyroid hormone every day, with water, >30 minutes before breakfast, separated by >4 hours from acid reflux medications, calcium, iron, multivitamins. Pt. Is not taking it correctly -she takes it around lunchtime with omeprazole taken in the morning.  We discussed about moving levothyroxine in the morning and omeprazole at least 4 hours later. - will check thyroid tests 1.5 months after the above change:  TSH and fT4 - If labs are abnormal, she will need to return for repeat TFTs in 1.5 months - I will see her back in 6 months  3.  Empty sella -Likely primary, since she does not have a history of pituitary tumor or other pituitary abnormalities -At last visit, we checked her pituitary function and it was normal with the exception of a low ACTH (but a low cortisol). -I advised her to come back to repeat the ACTH and cortisol, since sometimes miss manipulation of the ACTH sample can lead to undetectable levels.  She did not return for these. -Plan to repeat her ACTH and cortisol -she will return at 8 AM for these.  05/16/2019, MD PhD [facility] Endocrinology

## 2021-12-12 NOTE — Patient Instructions (Addendum)
Please come back for labs in ~5 weeks.  Continue Levothyroxine 125 mcg daily.  Take the thyroid hormone every day, with water, at least 30 minutes before breakfast, separated by at least 4 hours from: - acid reflux medications - calcium - iron - multivitamins  Move Levothyroxine to the morning.  Move Omeprazole >4h after Levothyroxine.  You should have an endocrinology follow-up appointment in 6 months.

## 2022-01-17 ENCOUNTER — Other Ambulatory Visit: Payer: BC Managed Care – PPO

## 2022-01-18 ENCOUNTER — Other Ambulatory Visit: Payer: BC Managed Care – PPO

## 2022-02-08 ENCOUNTER — Other Ambulatory Visit: Payer: BC Managed Care – PPO

## 2022-03-01 ENCOUNTER — Other Ambulatory Visit: Payer: BC Managed Care – PPO

## 2022-03-08 ENCOUNTER — Other Ambulatory Visit: Payer: BC Managed Care – PPO

## 2022-03-17 ENCOUNTER — Other Ambulatory Visit: Payer: Self-pay | Admitting: Internal Medicine

## 2022-03-21 ENCOUNTER — Other Ambulatory Visit: Payer: BC Managed Care – PPO

## 2022-04-25 ENCOUNTER — Other Ambulatory Visit: Payer: BC Managed Care – PPO

## 2022-05-10 ENCOUNTER — Other Ambulatory Visit: Payer: BC Managed Care – PPO

## 2022-05-24 ENCOUNTER — Other Ambulatory Visit: Payer: BC Managed Care – PPO

## 2022-06-02 ENCOUNTER — Other Ambulatory Visit: Payer: BC Managed Care – PPO

## 2022-06-07 ENCOUNTER — Other Ambulatory Visit: Payer: BC Managed Care – PPO

## 2022-06-12 ENCOUNTER — Encounter: Payer: Self-pay | Admitting: Internal Medicine

## 2022-06-12 ENCOUNTER — Ambulatory Visit (INDEPENDENT_AMBULATORY_CARE_PROVIDER_SITE_OTHER): Payer: BC Managed Care – PPO | Admitting: Internal Medicine

## 2022-06-12 VITALS — BP 120/80 | HR 73 | Ht 66.0 in | Wt 190.2 lb

## 2022-06-12 DIAGNOSIS — E039 Hypothyroidism, unspecified: Secondary | ICD-10-CM

## 2022-06-12 DIAGNOSIS — E236 Other disorders of pituitary gland: Secondary | ICD-10-CM

## 2022-06-12 NOTE — Patient Instructions (Addendum)
Please continue Levothyroxine 125 mg daily.  Take the thyroid hormone every day, with water, at least 30 minutes before breakfast, separated by at least 4 hours from: - acid reflux medications - calcium - iron - multivitamins  Please stop at the lab.  You should have an endocrinology follow-up appointment in 1 year.

## 2022-06-12 NOTE — Progress Notes (Signed)
Patient ID: Kristin [name], female   DOB: Sep 20, 1960, 62 y.o.   MRN: [id number]   HPI  Kristin [name] is a 62 y.o.-year-old female, initially referred by her PCP, Kristin, Dorinda Hill, PA-C, returning for follow-up for uncontrolled hypothyroidism and empty sella. Her adopted son is Kristin [name] (also my pt).  Last visit 6 months ago; previous visit: 2 years and 9 months prior, previous visit 2 years prior.  Interim history: At last visit she described bothersome hot flushes-restarted estrogen and Testosterone.  She feels much better. She started Ozempic - compounded - online. She and her husband are moving to [place] (1.5h away). She will come back to town for her neurosurgery follow-up.   Reviewed history: She was diagnosed with hypothyroidism in ~2000 >> on levothyroxine 137 mcg for a long time.  At last visit, she was taking levothyroxine at night around the time of the snack and omeprazole in the morning.  We will levothyroxine the morning and omeprazole at night.  I advised her to come back for labs after the change, but she did not return for the above reason.  Pt continues on levothyroxine 125 mcg daily (dose decreased 03/2019): - at night >> at 1-2 pm, after other meds >> moved to a.m. - + Omeprazole first thing in am >> stopped - fasting - at least 30 min from b'fast - no Ca, Fe, MVI - not on Biotin  Reviewed patient's TFTs: Lab Results  Component Value Date   TSH 0.24 (L) 03/18/2019   FREET4 1.08 03/18/2019  01/21/2016: TSH 8.9  Antithyroid antibodies: No results found for: "THGAB" No components found for: "TPOAB"   Pt denies: - feeling nodules in neck - hoarseness - choking  She has no known FH of thyroid disorders (only Graves ds. in adopted son). No FH of thyroid cancer. No h/o radiation tx to head or neck. No herbal supplements. No Biotin use. No recent steroids use.   Pt. also has a history of PUD, back pain, interstitial cystitis, fibromyalgia, GERD, HL. She has a  history of back surgeries and also oophorectomy.  Empty sella:  Previous hormonal investigations showed: Component     Latest Ref Rng & Units 03/18/2019  IGF-I, LC/MS     50 - 317 ng/mL 137  Z-Score (Female)     -2.0 - 2 SD 0.0  C206 ACTH     6 - 50 pg/mL <5 (L)  Cortisol, Plasma     ug/dL 7.0  Prolactin     ng/mL 6.7  LH     mIU/mL 7.90  FSH     mIU/ML 30.8  T4,Free(Direct)     0.60 - 1.60 ng/dL 4.09  TSH     8.11 - 9.14 uIU/mL 0.24 (L)  I advised her to return for repeat ACTH and cortisol but she did not do so.  She smokes 1 pack a day. She is trying to quit.  ROS: + See HPI  I reviewed pt's medications, allergies, PMH, social hx, family hx, and changes were documented in the history of present illness. Otherwise, unchanged from my initial visit note.  Past Medical History:  Diagnosis Date   Anxiety    Asthma    Depression    Fibromyalgia    GERD (gastroesophageal reflux disease)    Hypothyroidism    IC (interstitial cystitis)    Migraine    TMJ (temporomandibular joint disorder)    Past Surgical History:  Procedure Laterality Date   ABDOMINAL HYSTERECTOMY  2005   W/  RIGHT SALPINGOOPHORECTOMY   ANTERIOR CERVICAL DECOMP/DISCECTOMY FUSION  11-16-2009   C5  ---  C7   CARDIAC [place]HE[place]ATION  01-22-2006  [place]   NORMAL CORONARY ARTERIES/ NORMAL LVSF/  EF 55-60% /  MILD TO MODERATE DILATATION OF ASCENDING AORTA   CYSTO WITH HYDRODISTENSION N/A 01/07/2013   Procedure: CYSTOSCOPY/HYDRODISTENSION, INSTILL MARCAINE AND PYRIDIUM;  Surgeon: Kristin Pippin, MD;  Location: [facility];  Service: Urology;  Laterality: N/A;   CYSTO/  URETHRAL DILATION/  HYDRODISTENTION  12-17-2001   NASAL SINUS SURGERY  1994   TRANSTHORACIC ECHOCARDIOGRAM  09-08-2008   BORDERLINE LVH AND DIASTOLIC DYSFUNCTION/  EF 55%/  MILD AI   Social History   Socioeconomic History   Marital status: Married    Spouse name: Not on file   Number of children: Not on file   Years of  education: Not on file   Highest education level: Not on file  Occupational History   Not on file  Tobacco Use   Smoking status: Every Day    Packs/day: 1.50    Years: 11.00    Additional pack years: 0.00    Total pack years: 16.50    Types: Cigarettes   Smokeless tobacco: Never  Substance and Sexual Activity   Alcohol use: No   Drug use: No   Sexual activity: Not on file  Other Topics Concern   Not on file  Social History Narrative   Not on file   Social Determinants of Health   Financial Resource Strain: Not on file  Food Insecurity: Not on file  Transportation Needs: Not on file  Physical Activity: Not on file  Stress: Not on file  Social Connections: Not on file  Intimate Partner Violence: Not on file   Current Outpatient Medications on File Prior to Visit  Medication Sig Dispense Refill   ALBUTEROL IN Inhale into the lungs as needed.     escitalopram (LEXAPRO) 20 MG tablet Take 20 mg by mouth daily.     HYDROcodone-acetaminophen (NORCO) 7.5-325 MG per tablet Take 1 tablet by mouth every 6 (six) hours as needed for moderate pain.     ibuprofen (ADVIL,MOTRIN) 200 MG tablet Take 200 mg by mouth every 6 (six) hours as needed.     levothyroxine (SYNTHROID) 125 MCG tablet TAKE 1 TABLET BY MOUTH EVERY DAY BEFORE BREAKFAST 90 tablet 1   Melatonin 5 MG CAPS Take by mouth at bedtime.     Multiple Vitamin (MULTIVITAMIN) tablet Take 1 tablet by mouth daily.     Current Facility-Administered Medications on File Prior to Visit  Medication Dose Route Frequency Provider Last Rate Last Admin   bupivacaine (MARCAINE) 0.5 % 15 mL, phenazopyridine (PYRIDIUM) 400 mg bladder mixture   Bladder Instillation Once Kristin Pippin, MD       No Known Allergies   FH: - Herat ds in M, GM - HL in M - DM2 in P grandparents  PE: BP 120/80 (BP Location: Right Arm, Patient Position: Sitting, Cuff Size: Normal)   Pulse 73   Ht 5\' 6"  (1.676 m)   Wt 190 lb 3.2 oz (86.3 kg)   SpO2 96%   BMI 30.70  kg/m  Wt Readings from Last 3 Encounters:  06/12/22 190 lb 3.2 oz (86.3 kg)  12/12/21 204 lb (92.5 kg)  11/16/17 169 lb (76.7 kg)   Constitutional: overweight, in NAD Eyes:  EOMI, no exophthalmos ENT: no neck masses, no cervical lymphadenopathy Cardiovascular: RRR, No MRG Respiratory: CTA B Musculoskeletal: no deformities  Skin:no rashes Neurological: no tremor with outstretched hands  ASSESSMENT: 1. Hypothyroidism  2.  Empty sella  PLAN:  1. Patient with longstanding uncontrolled, hypothyroidism, on levothyroxine therapy.  She usually is noncompliant with appointments, now returns after 6 months, previous visit was after 2 years and 9 months. -He was previously taking levothyroxine at night and then we moved it to the morning - latest thyroid labs reviewed with pt. >> TSH was suppressed at last check: Lab Results  Component Value Date   TSH 0.24 (L) 03/18/2019  - she is on LT4 125 mcg daily - pt feels good on this dose. - we discussed about taking the thyroid hormone every day, with water, >30 minutes before breakfast, separated by >4 hours from acid reflux medications, calcium, iron, multivitamins. Pt. is taking it correctly now.  At last visit, she was taking it around lunchtime with omeprazole taken in the morning.  I advised her to take levothyroxine first thing in the morning and omeprazole at least 4 hours later.  She stopped omeprazole since last visit and takes levothyroxine fasting in the morning - will check thyroid tests today: TSH and fT4 - If labs are abnormal, she will need to return for repeat TFTs in 1.5 months - I will see her back in 6 months  3.  Empty sella -Likely primary since she does not have a history of pituitary tumor or other pituitary abnormalities -We previously checked her pituitary function and this was normal with the exception of a low ACTH but a normal cortisol -we discussed that this could have been related to lab error/sample degradation -She  did not return for repeat labs-will try to obtain them twice for her in the past... -will check ACTH and cortisol again today but need to repeat them if low (time: 4:30 pm) -No signs or symptoms of adrenal insufficiency -she lost weight, but this is intentional, with Ozempic  Office Visit on 06/12/2022  Component Date Value Ref Range Status   TSH 06/12/2022 2.28  0.35 - 5.50 uIU/mL Final   Free T4 06/12/2022 1.05  0.60 - 1.60 ng/dL Final   Comment: Specimens from patients who are undergoing biotin therapy and /or ingesting biotin supplements may contain high levels of biotin.  The higher biotin concentration in these specimens interferes with this Free T4 assay.  Specimens that contain high levels  of biotin may cause false high results for this Free T4 assay.  Please interpret results in light of the total clinical presentation of the patient.     B147 ACTH 06/12/2022 <5 (L)  6 - 50 pg/mL Final   Comment: . Reference range applies only to specimens collected between 7am-10am. .    Cortisol, Plasma 06/12/2022 1.2  ug/dL Final   AM:  4.3 - 82.9 ug/dLPM:  3.1 - 16.7 ug/dL   Cortisol is low -obtained at the end of the day.  ACTH is undetectable, which can be related to sample manipulation.  Patient was advised to come back for a cosyntropin stimulation test with time 0 cortisol and ACTH. Thyroid tests are normal.  Carlus Pavlov, MD PhD [facility] Endocrinology

## 2022-06-13 LAB — TSH: TSH: 2.28 u[IU]/mL (ref 0.35–5.50)

## 2022-06-13 LAB — T4, FREE: Free T4: 1.05 ng/dL (ref 0.60–1.60)

## 2022-06-13 LAB — CORTISOL: Cortisol, Plasma: 1.2 ug/dL

## 2022-06-17 LAB — ACTH: C206 ACTH: <5 pg/mL — ABNORMAL LOW (ref 6–50)

## 2022-06-19 ENCOUNTER — Other Ambulatory Visit: Payer: Self-pay | Admitting: Internal Medicine

## 2022-06-19 DIAGNOSIS — R7989 Other specified abnormal findings of blood chemistry: Secondary | ICD-10-CM

## 2022-06-19 DIAGNOSIS — E039 Hypothyroidism, unspecified: Secondary | ICD-10-CM

## 2022-06-22 ENCOUNTER — Other Ambulatory Visit: Payer: BC Managed Care – PPO

## 2022-06-23 ENCOUNTER — Other Ambulatory Visit: Payer: BC Managed Care – PPO

## 2022-06-23 ENCOUNTER — Ambulatory Visit: Payer: BC Managed Care – PPO

## 2022-06-27 ENCOUNTER — Ambulatory Visit: Payer: BC Managed Care – PPO

## 2022-06-27 ENCOUNTER — Other Ambulatory Visit: Payer: BC Managed Care – PPO

## 2022-06-28 ENCOUNTER — Ambulatory Visit: Payer: BC Managed Care – PPO

## 2022-06-28 ENCOUNTER — Other Ambulatory Visit: Payer: BC Managed Care – PPO

## 2022-07-04 ENCOUNTER — Other Ambulatory Visit (INDEPENDENT_AMBULATORY_CARE_PROVIDER_SITE_OTHER): Payer: BC Managed Care – PPO

## 2022-07-04 ENCOUNTER — Ambulatory Visit (INDEPENDENT_AMBULATORY_CARE_PROVIDER_SITE_OTHER): Payer: BC Managed Care – PPO

## 2022-07-04 ENCOUNTER — Ambulatory Visit: Payer: BC Managed Care – PPO

## 2022-07-04 DIAGNOSIS — R7989 Other specified abnormal findings of blood chemistry: Secondary | ICD-10-CM

## 2022-07-04 LAB — CORTISOL
Cortisol, Plasma: 5.7 ug/dL
Cortisol, Plasma: 15 ug/dL
Cortisol, Plasma: 19 ug/dL

## 2022-07-04 MED ORDER — COSYNTROPIN 0.25 MG IJ SOLR
0.2500 mg | Freq: Once | INTRAMUSCULAR | Status: AC
Start: 2022-07-04 — End: 2022-07-04
  Administered 2022-07-04: 0.25 mg via INTRAVENOUS

## 2022-07-04 NOTE — Progress Notes (Signed)
Patient verbally confirmed name, date of birth, and correct medication to be administered. Cosyntropin injection administered and pt tolerated well. Time recorded for phlebotomist.

## 2022-07-08 LAB — ACTH: C206 ACTH: 10 pg/mL (ref 6–50)

## 2022-07-24 ENCOUNTER — Telehealth: Payer: Self-pay | Admitting: Internal Medicine

## 2022-07-24 NOTE — Telephone Encounter (Signed)
I called and spoke with this patient and she states that she has been having issues with vomiting, keeping her body temp under control and really fatigue.She is wanting to know is there something else she can do that time.She states the vomiting is an all day thing and not just when she eats.The vomiting starts in the morning and then last all day. She is currently vomiting 4-5 times a day or more. Please advise,   Also before hanging up she has been having some Diarrhea as well, and the sweating is a lot like she has been working all day. Or she is freezing.

## 2022-07-24 NOTE — Telephone Encounter (Signed)
Patient is having some temperature issues and advising that she has been vomiting and having  several issues she is asking to speak to clinical staff ASAP to discuss what her next step should be. Please Advise. [phone]

## 2022-07-25 NOTE — Telephone Encounter (Signed)
Pt has been notified and voices understanding.  

## 2022-10-22 ENCOUNTER — Other Ambulatory Visit: Payer: Self-pay | Admitting: Internal Medicine

## 2023-05-31 ENCOUNTER — Other Ambulatory Visit: Payer: Self-pay | Admitting: Internal Medicine

## 2023-06-14 ENCOUNTER — Ambulatory Visit: Payer: BC Managed Care – PPO | Admitting: Internal Medicine

## 2023-06-14 NOTE — Progress Notes (Deleted)
 Patient ID: Kristin [name], female   DOB: 08/21/1960, 63 y.o.   MRN: [id number]   HPI  Kristin [name] is a 63 y.o.-year-old female, initially referred by her PCP, Bulla, Gwinda Leopard, PA-C, returning for follow-up for uncontrolled hypothyroidism and empty sella. Her adopted son is Bailey Bolus (also my pt).  Last visit 1 year ago.  Interim history: She previously had bothersome hot flashes improved after she restarted estrogen and testosterone before our last visit. She started compounded Ozempic before last visit-lost weight. She and her husband are moving to [place] (1.5h away). She will come back to town for her neurosurgery follow-up.   Reviewed history: She was diagnosed with hypothyroidism in ~2000 >> on levothyroxine  137 mcg for a long time.  At last visit, she was taking levothyroxine  at night around the time of the snack and omeprazole in the morning.  We will levothyroxine  the morning and omeprazole at night.  I advised her to come back for labs after the change, but she did not return for the above reason.  Pt continues on levothyroxine  125 mcg daily (dose decreased 03/2019): - at night >> at 1-2 pm, after other meds >> moved to a.m. - + Omeprazole first thing in am >> stopped - fasting - at least 30 min from b'fast - no Ca, Fe, MVI - not on Biotin  Reviewed patient's TFTs: Lab Results  Component Value Date   TSH 2.28 06/12/2022   TSH 0.24 (L) 03/18/2019   FREET4 1.05 06/12/2022   FREET4 1.08 03/18/2019  01/21/2016: TSH 8.9  Antithyroid antibodies: No results found for: "THGAB" No components found for: "TPOAB"   Pt denies: - feeling nodules in neck - hoarseness - choking  She has no known FH of thyroid  disorders (only Graves ds. in adopted son). No FH of thyroid  cancer. No h/o radiation tx to head or neck. No herbal supplements. No Biotin use. No recent steroids use.   Pt. also has a history of PUD, back pain, interstitial cystitis, fibromyalgia, GERD, HL. She has a  history of back surgeries and also oophorectomy.  Empty sella:  Previous hormonal investigations showed: Component     Latest Ref Rng & Units 03/18/2019  IGF-I, LC/MS     50 - 317 ng/mL 137  Z-Score (Female)     -2.0 - 2 SD 0.0  C206 ACTH      6 - 50 pg/mL <5 (L)  Cortisol, Plasma     ug/dL 7.0  Prolactin     ng/mL 6.7  LH     mIU/mL 7.90  FSH     mIU/ML 30.8  T4,Free(Direct)     0.60 - 1.60 ng/dL 1.66  TSH     0.63 - 0.16 uIU/mL 0.24 (L)   Component     Latest Ref Rng 06/12/2022  C206 ACTH      6 - 50 pg/mL <5 (L)   Cortisol, Plasma     ug/dL 1.2     A cosyntropin  stimulation test was normal: Component     Latest Ref Rng 07/04/2022  Cortisol, Plasma     ug/dL 5.7   Cortisol, Plasma     ug/dL 01.0   Cortisol, Plasma     ug/dL 93.2   T557 ACTH      6 - 50 pg/mL 10    She smokes 1 pack a day. She is trying to quit.  ROS: + See HPI  I reviewed pt's medications, allergies, PMH, social hx, family hx, and changes were documented in the history  of present illness. Otherwise, unchanged from my initial visit note.  Past Medical History:  Diagnosis Date   Anxiety    Asthma    Depression    Fibromyalgia    GERD (gastroesophageal reflux disease)    Hypothyroidism    IC (interstitial cystitis)    Migraine    TMJ (temporomandibular joint disorder)    Past Surgical History:  Procedure Laterality Date   ABDOMINAL HYSTERECTOMY  2005   W/ RIGHT SALPINGOOPHORECTOMY   ANTERIOR CERVICAL DECOMP/DISCECTOMY FUSION  11-16-2009   C5  ---  C7   CARDIAC CATHETERIZATION  01-22-2006  [place]   NORMAL CORONARY ARTERIES/ NORMAL LVSF/  EF 55-60% /  MILD TO MODERATE DILATATION OF ASCENDING AORTA   CYSTO WITH HYDRODISTENSION N/A 01/07/2013   Procedure: CYSTOSCOPY/HYDRODISTENSION, INSTILL MARCAINE  AND PYRIDIUM ;  Surgeon: Homero Luster, MD;  Location: [facility];  Service: Urology;  Laterality: N/A;   CYSTO/  URETHRAL DILATION/  HYDRODISTENTION  12-17-2001   NASAL SINUS  SURGERY  1994   TRANSTHORACIC ECHOCARDIOGRAM  09-08-2008   BORDERLINE LVH AND DIASTOLIC DYSFUNCTION/  EF 55%/  MILD AI   Social History   Socioeconomic History   Marital status: Married    Spouse name: Not on file   Number of children: Not on file   Years of education: Not on file   Highest education level: Not on file  Occupational History   Not on file  Tobacco Use   Smoking status: Every Day    Current packs/day: 1.50    Average packs/day: 1.5 packs/day for 11.0 years (16.5 ttl pk-yrs)    Types: Cigarettes   Smokeless tobacco: Never  Substance and Sexual Activity   Alcohol use: No   Drug use: No   Sexual activity: Not on file  Other Topics Concern   Not on file  Social History Narrative   Not on file   Social Drivers of Health   Financial Resource Strain: Not on file  Food Insecurity: Low Risk  (04/01/2023)   Received from [facility]   Hunger Vital Sign    Worried About Running Out of Food in the Last Year: Never true    Ran Out of Food in the Last Year: Never true  Transportation Needs: No Transportation Needs (04/01/2023)   Received from [facility]    In the past 12 months, has lack of reliable transportation kept you from medical appointments, meetings, work or from getting things needed for daily living? : No  Physical Activity: Not on file  Stress: Not on file  Social Connections: Not on file  Intimate Partner Violence: Not on file   Current Outpatient Medications on File Prior to Visit  Medication Sig Dispense Refill   ALBUTEROL IN Inhale into the lungs as needed.     escitalopram (LEXAPRO) 20 MG tablet Take 20 mg by mouth daily.     HYDROcodone-acetaminophen  (NORCO) 7.5-325 MG per tablet Take 1 tablet by mouth every 6 (six) hours as needed for moderate pain.     ibuprofen (ADVIL,MOTRIN) 200 MG tablet Take 200 mg by mouth every 6 (six) hours as needed.     levothyroxine  (SYNTHROID ) 125 MCG tablet TAKE 1 TABLET BY MOUTH EVERY DAY BEFORE  BREAKFAST 90 tablet 1   Melatonin 5 MG CAPS Take by mouth at bedtime.     Multiple Vitamin (MULTIVITAMIN) tablet Take 1 tablet by mouth daily.     Current Facility-Administered Medications on File Prior to Visit  Medication Dose  Route Frequency Provider Last Rate Last Admin   bupivacaine  (MARCAINE ) 0.5 % 15 mL, phenazopyridine  (PYRIDIUM ) 400 mg bladder mixture   Bladder Instillation Once Homero Luster, MD       No Known Allergies   FH: - Herat ds in M, GM - HL in M - DM2 in P grandparents  PE: There were no vitals taken for this visit. Wt Readings from Last 3 Encounters:  06/12/22 190 lb 3.2 oz (86.3 kg)  12/12/21 204 lb (92.5 kg)  11/16/17 169 lb (76.7 kg)   Constitutional: overweight, in NAD Eyes:  EOMI, no exophthalmos ENT: no neck masses, no cervical lymphadenopathy Cardiovascular: RRR, No MRG Respiratory: CTA B Musculoskeletal: no deformities Skin:no rashes Neurological: no tremor with outstretched hands  ASSESSMENT: 1. Hypothyroidism  2.  Empty sella  PLAN:  1. Patient with longstanding uncontrolled, hypothyroidism, on levothyroxine  therapy.  She is usually noncompliant with appointments. - She was previously taking levothyroxine  at night, and we will treat to the morning - latest thyroid  labs reviewed with pt. >> normal: Lab Results  Component Value Date   TSH 2.28 06/12/2022  - she continues on LT4 125 mcg daily - pt feels good on this dose. - we discussed about taking the thyroid  hormone every day, with water , >30 minutes before breakfast, separated by >4 hours from acid reflux medications, calcium, iron, multivitamins. Pt. is taking it correctly. - will check thyroid  tests today: TSH and fT4 - If labs are abnormal, she will need to return for repeat TFTs in 1.5 months  3.  Empty sella - Likely primary since she does not have a history of pituitary tumor or other pituitary abnormalities - Pituitary function was normal in the past with the exception of a low  ACTH  x 2, within normal cortisol-we discussed that this could be due to lab error/sample degradation, since this has to be within eyes right away. - Indeed, we had her back in the clinic for a cosyntropin  stimulation test and both ACTH  and cortisol were normal at all time points. - She had weight loss before last visit (14 pounds), but this was intentional, while on Ozempic - no further investigation is needed for now  Emilie Harden, MD PhD [facility] Endocrinology

## 2023-11-25 ENCOUNTER — Other Ambulatory Visit: Payer: Self-pay | Admitting: Internal Medicine
